# Patient Record
Sex: Female | Born: 1985 | Race: Black or African American | Hispanic: No | Marital: Married | Smoking: Never smoker
Health system: Southern US, Community
[De-identification: ages and names within clinical notes are randomized; demographics above are authoritative.]

## PROBLEM LIST (undated history)

## (undated) ENCOUNTER — Inpatient Hospital Stay (HOSPITAL_COMMUNITY): Payer: Self-pay

## (undated) DIAGNOSIS — Z8739 Personal history of other diseases of the musculoskeletal system and connective tissue: Secondary | ICD-10-CM

## (undated) DIAGNOSIS — F419 Anxiety disorder, unspecified: Secondary | ICD-10-CM

## (undated) DIAGNOSIS — B9689 Other specified bacterial agents as the cause of diseases classified elsewhere: Secondary | ICD-10-CM

## (undated) DIAGNOSIS — L409 Psoriasis, unspecified: Secondary | ICD-10-CM

## (undated) DIAGNOSIS — N760 Acute vaginitis: Secondary | ICD-10-CM

## (undated) DIAGNOSIS — O24419 Gestational diabetes mellitus in pregnancy, unspecified control: Secondary | ICD-10-CM

## (undated) DIAGNOSIS — B999 Unspecified infectious disease: Secondary | ICD-10-CM

## (undated) HISTORY — DX: Other specified bacterial agents as the cause of diseases classified elsewhere: B96.89

## (undated) HISTORY — DX: Personal history of other diseases of the musculoskeletal system and connective tissue: Z87.39

## (undated) HISTORY — DX: Acute vaginitis: N76.0

## (undated) HISTORY — PX: DILATION AND CURETTAGE OF UTERUS: SHX78

## (undated) HISTORY — DX: Gestational diabetes mellitus in pregnancy, unspecified control: O24.419

---

## 2012-04-03 ENCOUNTER — Ambulatory Visit (INDEPENDENT_AMBULATORY_CARE_PROVIDER_SITE_OTHER): Payer: PRIVATE HEALTH INSURANCE | Admitting: Obstetrics and Gynecology

## 2012-04-03 ENCOUNTER — Encounter: Payer: Self-pay | Admitting: Obstetrics and Gynecology

## 2012-04-03 VITALS — BP 112/68 | Ht 62.0 in | Wt 131.0 lb

## 2012-04-03 DIAGNOSIS — Z01419 Encounter for gynecological examination (general) (routine) without abnormal findings: Secondary | ICD-10-CM

## 2012-04-03 DIAGNOSIS — N760 Acute vaginitis: Secondary | ICD-10-CM

## 2012-04-03 DIAGNOSIS — B9689 Other specified bacterial agents as the cause of diseases classified elsewhere: Secondary | ICD-10-CM

## 2012-04-03 DIAGNOSIS — N898 Other specified noninflammatory disorders of vagina: Secondary | ICD-10-CM

## 2012-04-03 DIAGNOSIS — A499 Bacterial infection, unspecified: Secondary | ICD-10-CM

## 2012-04-03 MED ORDER — TINIDAZOLE 500 MG PO TABS: ORAL_TABLET | ORAL | Status: DC

## 2012-04-03 MED ORDER — LEVONORGESTREL-ETHINYL ESTRAD 0.1-20 MG-MCG PO TABS: 1.0000 | ORAL_TABLET | Freq: Every day | ORAL | Status: DC

## 2012-04-03 NOTE — Progress Notes (Signed)
The patient reports:normal menses, no abnormal bleeding, pelvic pain or discharge  Contraception:oral contraceptives (estrogen/progesterone)lutera  Last mammogram: patient has never had a mammogram  Last pap: was normal 2011  GC/Chlamydia cultures offered: declined HIV/RPR/HbsAg offered:  declined HSV 1 and 2 glycoprotein offered: declined  Menstrual cycle regular and monthly: Yes Menstrual flow normal: Yes  Urinary symptoms: none Normal bowel movements: Yes Reports abuse at home: No:  Pt stated bv ?  Subjective:    Carrie Coffey is a 26 y.o. female, G1P0010, who presents for an annual exam.     History   Social History  . Marital Status: Married    Spouse Name: N/A    Number of Children: N/A  . Years of Education: N/A   Social History Main Topics  . Smoking status: Never Smoker   . Smokeless tobacco: Never Used  . Alcohol Use: 0.0 oz/week    4-5 Glasses of wine per week  . Drug Use: No  . Sexually Active: Yes    Birth Control/ Protection: Pill     lutera   Other Topics Concern  . None   Social History Narrative  . None    Menstrual cycle:   LMP: Patient's last menstrual period was 03/28/2012.           Cycle: normal with BCP  The following portions of the patient's history were reviewed and updated as appropriate: allergies, current medications, past family history, past medical history, past social history, past surgical history and problem list.  Review of Systems Pertinent items are noted in HPI. Breast:Negative for breast lump,nipple discharge or nipple retraction Gastrointestinal: Negative for abdominal pain, change in bowel habits or rectal bleeding Urinary:negative   Objective:    BP 112/68  Ht 5\' 2"  (1.575 m)  Wt 131 lb (59.421 kg)  BMI 23.96 kg/m2  LMP 03/28/2012    Weight:  Wt Readings from Last 1 Encounters:  04/03/12 131 lb (59.421 kg)          BMI: Body mass index is 23.96 kg/(m^2).  General Appearance: Alert, appropriate appearance  for age. No acute distress HEENT: Grossly normal Neck / Thyroid: Supple, no masses, nodes or enlargement Lungs: clear to auscultation bilaterally Back: No CVA tenderness Breast Exam: No masses or nodes.No dimpling, nipple retraction or discharge. Right nipple piercing Cardiovascular: Regular rate and rhythm. S1, S2, no murmur Gastrointestinal: Soft, non-tender, no masses or organomegaly Pelvic Exam: Vulva and vagina appear normal. Bimanual exam reveals normal uterus and adnexa. Rectovaginal: not indicated Lymphatic Exam: Non-palpable nodes in neck, clavicular, axillary, or inguinal regions Skin: no rash or abnormalities Neurologic: Normal gait and speech, no tremor  Psychiatric: Alert and oriented, appropriate affect.   OSOM BV: positive   Assessment:    Normal gyn exam BV    Plan:    pap smear return annually or prn Tindamax prescribed STD screening: declined Contraception:oral contraceptives (estrogen/progesterone) Laymond Purser JMD

## 2012-04-04 LAB — PAP IG W/ RFLX HPV ASCU

## 2012-08-08 ENCOUNTER — Telehealth: Payer: Self-pay | Admitting: Obstetrics and Gynecology

## 2012-08-08 NOTE — Telephone Encounter (Signed)
Tc to pt regarding msg.  Pt is currently on Lutera and has been since 01/2012.  Since starting Aldean Ast has been having upper thigh pain, pt says she then stopped the birth control for a while and the pain stopped, she restarted the pills again and the pain in her thigh returned and has gotten worse.  Pt scheduled for an appt w/ SR on Thursday 08/14/12 @ 1350 and advised the pt to stop the pills until she comes in for a different option and to use condoms, pt voices agreement.

## 2012-08-14 ENCOUNTER — Ambulatory Visit: Payer: BC Managed Care – PPO | Admitting: Obstetrics and Gynecology

## 2012-08-14 ENCOUNTER — Encounter: Payer: Self-pay | Admitting: Obstetrics and Gynecology

## 2012-08-14 VITALS — BP 92/50 | Ht 61.0 in | Wt 137.0 lb

## 2012-08-14 DIAGNOSIS — Z309 Encounter for contraceptive management, unspecified: Secondary | ICD-10-CM

## 2012-08-14 MED ORDER — DROSPIRENONE-ETHINYL ESTRADIOL 3-0.02 MG PO TABS: 1.0000 | ORAL_TABLET | Freq: Every day | ORAL | Status: DC

## 2012-08-14 NOTE — Progress Notes (Signed)
Subjective:    Carrie Coffey is a 27 y.o. female, G1P0010, who presents for discuss BCP options due to right upper thigh pain x 2 months. Pt states it  was a very dull radiating pain. Pt stopped Lutera 1 week ago but now is spotting. Pt has a remote history of rheumatology work-up with "a slight increased risk of blood clot but OK to take BCP"  The following portions of the patient's history were reviewed and updated as appropriate: allergies, current medications, past family history.  Review of Systems Pertinent items are noted in HPI.     Objective:    There were no vitals taken for this visit.    Weight:  Wt Readings from Last 1 Encounters:  04/03/12 131 lb (59.421 kg)          BMI: There is no height or weight on file to calculate BMI.  General Appearance: Alert, appropriate appearance for age. No acute distress Eetrermities:no evidence of DVT. Homan - no edema   Assessment:    Contraceptive management  No evidence of DVT   Plan:    Change to Yaz with BUM x 1 month Pt to obtain records from [facility] to review prior to pregnancy return annually or prn  Silverio Lay MD

## 2013-07-29 ENCOUNTER — Inpatient Hospital Stay (HOSPITAL_COMMUNITY)
Admission: AD | Admit: 2013-07-29 | Discharge: 2013-07-29 | Disposition: A | Discharge status: Home / Self Care | Payer: BC Managed Care – PPO | Source: Ambulatory Visit | Attending: Obstetrics and Gynecology | Admitting: Obstetrics and Gynecology

## 2013-07-29 ENCOUNTER — Encounter (HOSPITAL_COMMUNITY): Payer: Self-pay | Admitting: *Deleted

## 2013-07-29 DIAGNOSIS — O00109 Unspecified tubal pregnancy without intrauterine pregnancy: Secondary | ICD-10-CM | POA: Insufficient documentation

## 2013-07-29 LAB — BUN: BUN: 6 mg/dL (ref 6–23)

## 2013-07-29 LAB — CBC WITH DIFFERENTIAL/PLATELET
BASOS PCT: 0 % (ref 0–1)
Basophils Absolute: 0 10*3/uL (ref 0.0–0.1)
EOS ABS: 0 10*3/uL (ref 0.0–0.7)
Eosinophils Relative: 1 % (ref 0–5)
HEMATOCRIT: 36.5 % (ref 36.0–46.0)
Hemoglobin: 12.5 g/dL (ref 12.0–15.0)
Lymphocytes Relative: 34 % (ref 12–46)
Lymphs Abs: 2.3 10*3/uL (ref 0.7–4.0)
MCH: 32.9 pg (ref 26.0–34.0)
MCHC: 34.2 g/dL (ref 30.0–36.0)
MCV: 96.1 fL (ref 78.0–100.0)
MONO ABS: 0.5 10*3/uL (ref 0.1–1.0)
Monocytes Relative: 8 % (ref 3–12)
Neutro Abs: 3.8 10*3/uL (ref 1.7–7.7)
Neutrophils Relative %: 57 % (ref 43–77)
Platelets: 264 10*3/uL (ref 150–400)
RBC: 3.8 MIL/uL — ABNORMAL LOW (ref 3.87–5.11)
RDW: 12.7 % (ref 11.5–15.5)
WBC: 6.6 10*3/uL (ref 4.0–10.5)

## 2013-07-29 LAB — HCG, QUANTITATIVE, PREGNANCY: hCG, Beta Chain, Quant, S: 3283 m[IU]/mL — ABNORMAL HIGH (ref <5)

## 2013-07-29 LAB — CREATININE, SERUM: CREATININE: 0.63 mg/dL (ref 0.50–1.10)

## 2013-07-29 LAB — AST: AST: 16 U/L (ref 0–37)

## 2013-07-29 MED ORDER — ONDANSETRON 4 MG PO TBDP: 4.0000 mg | ORAL_TABLET | Freq: Three times a day (TID) | ORAL | Status: DC | PRN

## 2013-07-29 MED ORDER — METHOTREXATE INJECTION FOR WOMEN'S HOSPITAL
50.0000 mg/m2 | Freq: Once | INTRAMUSCULAR | Status: AC
  Administered 2013-07-29: 85 mg via INTRAMUSCULAR
  Filled 2013-07-29: qty 1.7

## 2013-07-29 NOTE — Discharge Instructions (Signed)
Ectopic Pregnancy An ectopic pregnancy is when the fertilized egg attaches (implants) outside the uterus. Most ectopic pregnancies occur in the fallopian tube. Rarely do ectopic pregnancies occur on the ovary, intestine, pelvis, or cervix. In an ectopic pregnancy, the fertilized egg does not have the ability to develop into a normal, healthy baby.  A ruptured ectopic pregnancy is one in which the fallopian tube gets torn or bursts and results in internal bleeding. Often there is intense abdominal pain, and sometimes, vaginal bleeding. Having an ectopic pregnancy can be life threatening. If left untreated, this dangerous condition can lead to a blood transfusion, abdominal surgery, or even death. CAUSES  Damage to the fallopian tubes is the suspected cause in most ectopic pregnancies.  RISK FACTORS Depending on your circumstances, the risk of having an ectopic pregnancy will vary. The level of risk can be divided into three categories. High Risk  You have gone through infertility treatment.  You have had a previous ectopic pregnancy.  You have had previous tubal surgery.  You have had previous surgery to have the fallopian tubes tied (tubal ligation).  You have tubal problems or diseases.  You have been exposed to DES. DES is a medicine that was used until 1971 and had effects on babies whose mothers took the medicine.  You become pregnant while using an intrauterine device (IUD) for birth control. Moderate Risk  You have a history of infertility.  You have a history of a sexually transmitted infection (STI).  You have a history of pelvic inflammatory disease (PID).  You have scarring from endometriosis.  You have multiple sexual partners.  You smoke. Low Risk  You have had previous pelvic surgery.  You use vaginal douching.  You became sexually active before 28 years of age. SIGNS AND SYMPTOMS  An ectopic pregnancy should be suspected in anyone who has missed a period and  has abdominal pain or bleeding.  You may experience normal pregnancy symptoms, such as:  Nausea.  Tiredness.  Breast tenderness.  Other symptoms may include:  Pain with intercourse.  Irregular vaginal bleeding or spotting.  Cramping or pain on one side or in the lower abdomen.  Fast heartbeat.  Passing out while having a bowel movement.  Symptoms of a ruptured ectopic pregnancy and internal bleeding may include:  Sudden, severe pain in the abdomen and pelvis.  Dizziness or fainting.  Pain in the shoulder area. DIAGNOSIS  Tests that may be performed include:  A pregnancy test.  An ultrasound test.  Testing the specific level of pregnancy hormone in the bloodstream.  Taking a sample of uterus tissue (dilation and curettage, D&C).  Surgery to perform a visual exam of the inside of the abdomen using a thin, lighted tube with a tiny camera on the end (laparoscope). TREATMENT  An injection of a medicine called methotrexate may be given. This medicine causes the pregnancy tissue to be absorbed. It is given if:  The diagnosis is made early.  The fallopian tube has not ruptured.  You are considered to be a good candidate for the medicine. Usually, pregnancy hormone blood levels are checked after methotrexate treatment. This is to be sure the medicine is effective. It may take 4 6 weeks for the pregnancy to be absorbed (though most pregnancies will be absorbed by 3 weeks). Surgical treatment may be needed. A laparoscope may be used to remove the pregnancy tissue. If severe internal bleeding occurs, a cut (incision) may be made in the lower abdomen (laparotomy), and the  ectopic pregnancy is removed. This stops the bleeding. Part of the fallopian tube, or the whole tube, may be removed as well (salpingectomy). After surgery, pregnancy hormone tests may be done to be sure there is no pregnancy tissue left. You may receive an Rho(D) immune globulin shot if you are Rh negative and  the father is Rh positive, or if you do not know the Rh type of the father. This is to prevent problems with any future pregnancy. SEEK IMMEDIATE MEDICAL CARE IF:  You have any symptoms of an ectopic pregnancy. This is a medical emergency. Document Released: 08/02/2004 Document Revised: 04/15/2013 Document Reviewed: 01/22/2013 [facility] Patient Information 2014 [place], [place].   Methotrexate Treatment for an Ectopic Pregnancy An ectopic pregnancy is when the fertilized egg attaches (implants) outside the uterus. Most ectopic pregnancies occur in the fallopian tube. Rarely do ectopic pregnancies occur on the ovary, intestine, pelvis, or cervix. An ectopic pregnancy does not have the ability to develop into a normal, healthy baby. Having an ectopic pregnancy can be a life-threatening experience. However, if the ectopic pregnancy is found early enough, it can be treated with a medicine. This medicine is called methotrexate. Methotrexate works by stopping the pregnancy from growing. It helps the body absorb the pregnancy tissue over a 2 to 6 week period (though most pregnancies will be absorbed by 3 weeks).  If methotrexate is successful, there is a good chance that the fallopian tube may be saved. Regardless of whether the fallopian tube is saved, a mother who has had an ectopic pregnancy is at a much higher risk of having another ectopic occur in future pregnancies. One serious concern is the potential for the fallopian tube to tear (rupture). If it does, emergency surgery is needed to remove the pregnancy, and methotrexate cannot be used. The ideal patient for methotrexate is a person who is:   Not bleeding internally.  Has no severe or persistent abdominal pain.  Is committed to following through with lab tests and appointments until the ectopic has absorbed.  Is healthy and has normal liver and kidney functions on evaluation. Methotrexate should not be given to women who:  Are  breastfeeding.  Have a normal pregnancy (intrauterine pregnancy).  Have liver, lung, or kidney disease.  Have blood problems.  Are allergic to methotrexate.  Have peptic ulcers.  Have an ectopic pregnancy larger than 1 inches (3.5 cm) or one that has fetal heartbeats. This is a rule that is followed most of the time (relative contraindication). BEFORE THE TREATMENT Before giving the medicine:  Liver tests, kidney tests, and a complete blood test are performed.  Blood tests are performed to measure the pregnancy hormone levels and to determine the mother's blood type.  If the woman is Rh negative, and the father is Rh positive or his Rh type is not known, a RhoGAM shot is given. TREATMENT  There are 2 methods that your caregiver may use to prescribe methotrexate. One method involves a single dose or injection of the medicine. Another method involves a series of doses. This method involves several injections.  AFTER THE TREATMENT Blood tests will be taken for several weeks to check the pregnancy hormone levels. The blood tests are performed until there is no more pregnancy hormone detected in the blood. There is still a risk of the ectopic pregnancy rupturing while using the methotrexate. There are also side effects of methotrexate, which include:   Nausea and vomiting.  Mouth sores.  Diarrhea.  Rash.  Dizziness.  abdominal pain. °· Increased vaginal bleeding or spotting. °· Pneumonia. °· Failed treatment. °· Hair loss. This is rare and reversible. °On very rare occasions, the medicine may affect your blood counts, liver, kidney, bone marrow, or hormone levels. If this happens, your caregiver will want to perform further evaluations. °Document Released: 06/19/2001 Document Revised: 09/17/2011 Document Reviewed: 03/01/2011 °ExitCare® Patient Information ©2014 [employer]. ° °

## 2013-07-29 NOTE — MAU Provider Note (Signed)
History   28yo, G2P0010 at [redacted]w[redacted]d was sent over from the office after u/s showed no IUP and a mass superior medial to the left ovary.  Peripheral color flow seen.  Mass is 2.5 cm x 1.06 cm x 1.2 cm.  Denies pain or bleeding today.  Denies recent fever, resp or GI c/o's, UTI s/s.   1/14 Quant = 502 1/19 Quant = 2567.2 Blood type = O pos  Chief Complaint  Patient presents with  . Abdominal Pain   HPI  OB History   Grav Para Term Preterm Abortions TAB SAB Ect Mult Living   2    1  1          Past Medical History  Diagnosis Date  . BV (bacterial vaginosis)   . H/O joint problems     History reviewed. No pertinent past surgical history.  History reviewed. No pertinent family history.  History  Substance Use Topics  . Smoking status: Never Smoker   . Smokeless tobacco: Never Used  . Alcohol Use: 0.0 oz/week    4-5 Glasses of wine per week    Allergies:  Allergies  Allergen Reactions  . Allegra Allergy [Fexofenadine Hcl] Hives  . Fruit & Vegetable Daily [Nutritional Supplements]     Apples   . Loratadine-Pseudoephedrine Er Hives    Prescriptions prior to admission  Medication Sig Dispense Refill  . clonazePAM (KLONOPIN) 0.5 MG tablet Take 0.5 mg by mouth 2 (two) times daily as needed for anxiety.      . Prenatal Vit-Fe Fumarate-FA (PRENATAL MULTIVITAMIN) TABS tablet Take 1 tablet by mouth daily at 12 noon.        ROS ROS: see HPI above, all other systems are negative  Physical Exam   Blood pressure 132/73, pulse 91, temperature 98.7 F (37.1 C), temperature source Oral, resp. rate 16, height 5\' 2"  (1.575 m), weight 138 lb (62.596 kg), last menstrual period 07/18/2012, SpO2 100.00%.  Physical Exam Chest: Clear Heart: RRR Abdomen: gravid, NT Extremities: WNL  Results for orders placed during the hospital encounter of 07/29/13 (from the past 24 hour(s))  TYPE AND SCREEN     Status: None   Collection Time    07/29/13  9:30 PM      Result Value Range   ABO/RH(D) O POS     Antibody Screen PENDING     Sample Expiration 08/01/2013    CREATININE, SERUM     Status: None   Collection Time    07/29/13  9:30 PM      Result Value Range   Creatinine, Ser 0.63  0.50 - 1.10 mg/dL   GFR calc non Af Amer >90  >90 mL/min   GFR calc Af Amer >90  >90 mL/min  BUN     Status: None   Collection Time    07/29/13  9:30 PM      Result Value Range   BUN 6  6 - 23 mg/dL  AST     Status: None   Collection Time    07/29/13  9:30 PM      Result Value Range   AST 16  0 - 37 U/L  CBC WITH DIFFERENTIAL     Status: Abnormal   Collection Time    07/29/13  9:30 PM      Result Value Range   WBC 6.6  4.0 - 10.5 K/uL   RBC 3.80 (*) 3.87 - 5.11 MIL/uL   Hemoglobin 12.5  12.0 - 15.0 g/dL   HCT  36.5  36.0 - 46.0 %   MCV 96.1  78.0 - 100.0 fL   MCH 32.9  26.0 - 34.0 pg   MCHC 34.2  30.0 - 36.0 g/dL   RDW 12.7  11.5 - 15.5 %   Platelets 264  150 - 400 K/uL   Neutrophils Relative % 57  43 - 77 %   Neutro Abs 3.8  1.7 - 7.7 K/uL   Lymphocytes Relative 34  12 - 46 %   Lymphs Abs 2.3  0.7 - 4.0 K/uL   Monocytes Relative 8  3 - 12 %   Monocytes Absolute 0.5  0.1 - 1.0 K/uL   Eosinophils Relative 1  0 - 5 %   Eosinophils Absolute 0.0  0.0 - 0.7 K/uL   Basophils Relative 0  0 - 1 %   Basophils Absolute 0.0  0.0 - 0.1 K/uL  HCG, QUANTITATIVE, PREGNANCY     Status: Abnormal   Collection Time    07/29/13  9:30 PM      Result Value Range   hCG, Beta Chain, Quant, S 3283 (*) <5 mIU/mL    ED Course  Ectopic pregnancy at [redacted]w[redacted]d by LMP  R&B of both Methotrexate and surgery were reviewed with the patient -- patient verbalizes understanding of these risks and wishes to proceed with Methotrexate therapy.  Labs ordered - WNL CBC AST BUN Creatinine Quant Methotrexate given  Precautions and SE discussed  Zofran 4mg  ODT rx'd To f/u on Saturday (day 4) for repeat quant in MAU     Linda Hedges CNM, MSN 07/29/2013 8:23 PM

## 2013-07-29 NOTE — MAU Note (Signed)
Pt states she was seen in the office at [facility] pregnancy test. Pt states  Her LMP June 11, 2013 pt had hormone levels which were doubling but nothing noted in the uterus in the office.Pt sent her for further evaluation.

## 2013-07-29 NOTE — MAU Note (Signed)
Pt reports she is 6 weeks preg and has had some spotting during pregnancy. They have been following BHCG's and they have been doubling but had more spotting today and had an ultrasound at the office and they called her this pm and said they did not see anything in the uterus and wanted her to come in to rule out ectopic.

## 2013-07-30 ENCOUNTER — Other Ambulatory Visit: Payer: Self-pay | Admitting: Obstetrics and Gynecology

## 2013-07-30 DIAGNOSIS — O3680X0 Pregnancy with inconclusive fetal viability, not applicable or unspecified: Secondary | ICD-10-CM

## 2013-07-31 LAB — TYPE AND SCREEN
ABO/RH(D): O POS
Antibody Screen: POSITIVE
DAT, IGG: NEGATIVE
UNIT DIVISION: 0
Unit division: 0

## 2013-08-01 ENCOUNTER — Inpatient Hospital Stay (HOSPITAL_COMMUNITY)
Admission: AD | Admit: 2013-08-01 | Discharge: 2013-08-01 | Disposition: A | Discharge status: Home / Self Care | Payer: BC Managed Care – PPO | Source: Ambulatory Visit | Attending: Obstetrics and Gynecology | Admitting: Obstetrics and Gynecology

## 2013-08-01 ENCOUNTER — Encounter (HOSPITAL_COMMUNITY): Payer: Self-pay | Admitting: *Deleted

## 2013-08-01 ENCOUNTER — Other Ambulatory Visit (HOSPITAL_COMMUNITY): Payer: Self-pay | Admitting: Obstetrics and Gynecology

## 2013-08-01 DIAGNOSIS — M542 Cervicalgia: Secondary | ICD-10-CM | POA: Insufficient documentation

## 2013-08-01 DIAGNOSIS — O00109 Unspecified tubal pregnancy without intrauterine pregnancy: Secondary | ICD-10-CM | POA: Insufficient documentation

## 2013-08-01 DIAGNOSIS — K6289 Other specified diseases of anus and rectum: Secondary | ICD-10-CM | POA: Insufficient documentation

## 2013-08-01 DIAGNOSIS — O009 Unspecified ectopic pregnancy without intrauterine pregnancy: Secondary | ICD-10-CM | POA: Diagnosis present

## 2013-08-01 DIAGNOSIS — R109 Unspecified abdominal pain: Secondary | ICD-10-CM | POA: Insufficient documentation

## 2013-08-01 HISTORY — DX: Psoriasis, unspecified: L40.9

## 2013-08-01 LAB — CBC
HCT: 37.5 % (ref 36.0–46.0)
Hemoglobin: 12.9 g/dL (ref 12.0–15.0)
MCH: 32.7 pg (ref 26.0–34.0)
MCHC: 34.4 g/dL (ref 30.0–36.0)
MCV: 95.2 fL (ref 78.0–100.0)
Platelets: 255 10*3/uL (ref 150–400)
RBC: 3.94 MIL/uL (ref 3.87–5.11)
RDW: 12.6 % (ref 11.5–15.5)
WBC: 5.1 10*3/uL (ref 4.0–10.5)

## 2013-08-01 LAB — HCG, QUANTITATIVE, PREGNANCY: hCG, Beta Chain, Quant, S: 4811 m[IU]/mL — ABNORMAL HIGH (ref <5)

## 2013-08-01 NOTE — MAU Note (Signed)
Pt. Here for follow up labs from January 21. Pt. Complaining of neck pain and rectal pain. Also, having left sided abdominal pain that patient rates 3/10. Denies any bleeding. Pt. States she is nauseated. Denies vomiting. Denies diarrhea.

## 2013-08-01 NOTE — Discharge Instructions (Signed)
Ectopic Pregnancy An ectopic pregnancy is when the fertilized egg attaches (implants) outside the uterus. Most ectopic pregnancies occur in the fallopian tube. Rarely do ectopic pregnancies occur on the ovary, intestine, pelvis, or cervix. In an ectopic pregnancy, the fertilized egg does not have the ability to develop into a normal, healthy baby.  A ruptured ectopic pregnancy is one in which the fallopian tube gets torn or bursts and results in internal bleeding. Often there is intense abdominal pain, and sometimes, vaginal bleeding. Having an ectopic pregnancy can be life threatening. If left untreated, this dangerous condition can lead to a blood transfusion, abdominal surgery, or even death. CAUSES  Damage to the fallopian tubes is the suspected cause in most ectopic pregnancies.  RISK FACTORS Depending on your circumstances, the risk of having an ectopic pregnancy will vary. The level of risk can be divided into three categories. High Risk  You have gone through infertility treatment.  You have had a previous ectopic pregnancy.  You have had previous tubal surgery.  You have had previous surgery to have the fallopian tubes tied (tubal ligation).  You have tubal problems or diseases.  You have been exposed to DES. DES is a medicine that was used until 1971 and had effects on babies whose mothers took the medicine.  You become pregnant while using an intrauterine device (IUD) for birth control. Moderate Risk  You have a history of infertility.  You have a history of a sexually transmitted infection (STI).  You have a history of pelvic inflammatory disease (PID).  You have scarring from endometriosis.  You have multiple sexual partners.  You smoke. Low Risk  You have had previous pelvic surgery.  You use vaginal douching.  You became sexually active before 28 years of age. SIGNS AND SYMPTOMS  An ectopic pregnancy should be suspected in anyone who has missed a period and  has abdominal pain or bleeding.  You may experience normal pregnancy symptoms, such as:  Nausea.  Tiredness.  Breast tenderness.  Other symptoms may include:  Pain with intercourse.  Irregular vaginal bleeding or spotting.  Cramping or pain on one side or in the lower abdomen.  Fast heartbeat.  Passing out while having a bowel movement.  Symptoms of a ruptured ectopic pregnancy and internal bleeding may include:  Sudden, severe pain in the abdomen and pelvis.  Dizziness or fainting.  Pain in the shoulder area. DIAGNOSIS  Tests that may be performed include:  A pregnancy test.  An ultrasound test.  Testing the specific level of pregnancy hormone in the bloodstream.  Taking a sample of uterus tissue (dilation and curettage, D&C).  Surgery to perform a visual exam of the inside of the abdomen using a thin, lighted tube with a tiny camera on the end (laparoscope). TREATMENT  An injection of a medicine called methotrexate may be given. This medicine causes the pregnancy tissue to be absorbed. It is given if:  The diagnosis is made early.  The fallopian tube has not ruptured.  You are considered to be a good candidate for the medicine. Usually, pregnancy hormone blood levels are checked after methotrexate treatment. This is to be sure the medicine is effective. It may take 4 6 weeks for the pregnancy to be absorbed (though most pregnancies will be absorbed by 3 weeks). Surgical treatment may be needed. A laparoscope may be used to remove the pregnancy tissue. If severe internal bleeding occurs, a cut (incision) may be made in the lower abdomen (laparotomy), and the  ectopic pregnancy is removed. This stops the bleeding. Part of the fallopian tube, or the whole tube, may be removed as well (salpingectomy). After surgery, pregnancy hormone tests may be done to be sure there is no pregnancy tissue left. You may receive an Rho(D) immune globulin shot if you are Rh negative and  the father is Rh positive, or if you do not know the Rh type of the father. This is to prevent problems with any future pregnancy. °SEEK IMMEDIATE MEDICAL CARE IF:  °You have any symptoms of an ectopic pregnancy. This is a medical emergency. °Document Released: 08/02/2004 Document Revised: 04/15/2013 Document Reviewed: 01/22/2013 °ExitCare® Patient Information ©2014 [employer]. ° °

## 2013-08-05 ENCOUNTER — Inpatient Hospital Stay (HOSPITAL_COMMUNITY)
Admission: AD | Admit: 2013-08-05 | Discharge: 2013-08-05 | Disposition: A | Discharge status: Home / Self Care | Payer: BC Managed Care – PPO | Source: Ambulatory Visit | Attending: Obstetrics and Gynecology | Admitting: Obstetrics and Gynecology

## 2013-08-05 DIAGNOSIS — O00109 Unspecified tubal pregnancy without intrauterine pregnancy: Secondary | ICD-10-CM | POA: Insufficient documentation

## 2013-08-05 LAB — COMPREHENSIVE METABOLIC PANEL
ALBUMIN: 3.5 g/dL (ref 3.5–5.2)
ALT: 20 U/L (ref 0–35)
AST: 25 U/L (ref 0–37)
Alkaline Phosphatase: 45 U/L (ref 39–117)
BILIRUBIN TOTAL: 0.5 mg/dL (ref 0.3–1.2)
BUN: 8 mg/dL (ref 6–23)
CHLORIDE: 103 meq/L (ref 96–112)
CO2: 20 mEq/L (ref 19–32)
Calcium: 9.1 mg/dL (ref 8.4–10.5)
Creatinine, Ser: 0.73 mg/dL (ref 0.50–1.10)
GFR calc Af Amer: >90 mL/min (ref >90)
Glucose, Bld: 100 mg/dL — ABNORMAL HIGH (ref 70–99)
Potassium: 3.8 mEq/L (ref 3.7–5.3)
Sodium: 137 mEq/L (ref 137–147)
Total Protein: 7.7 g/dL (ref 6.0–8.3)

## 2013-08-05 LAB — CBC WITH DIFFERENTIAL/PLATELET
Basophils Absolute: 0 10*3/uL (ref 0.0–0.1)
Basophils Relative: 1 % (ref 0–1)
Eosinophils Absolute: 0 10*3/uL (ref 0.0–0.7)
Eosinophils Relative: 1 % (ref 0–5)
HCT: 36.2 % (ref 36.0–46.0)
Hemoglobin: 12.2 g/dL (ref 12.0–15.0)
LYMPHS ABS: 2.1 10*3/uL (ref 0.7–4.0)
LYMPHS PCT: 47 % — AB (ref 12–46)
MCH: 32.3 pg (ref 26.0–34.0)
MCHC: 33.7 g/dL (ref 30.0–36.0)
MCV: 95.8 fL (ref 78.0–100.0)
Monocytes Absolute: 0.4 10*3/uL (ref 0.1–1.0)
Monocytes Relative: 10 % (ref 3–12)
NEUTROS PCT: 42 % — AB (ref 43–77)
Neutro Abs: 1.8 10*3/uL (ref 1.7–7.7)
Platelets: 282 10*3/uL (ref 150–400)
RBC: 3.78 MIL/uL — ABNORMAL LOW (ref 3.87–5.11)
RDW: 13.1 % (ref 11.5–15.5)
WBC: 4.4 10*3/uL (ref 4.0–10.5)

## 2013-08-05 LAB — HCG, QUANTITATIVE, PREGNANCY: hCG, Beta Chain, Quant, S: 4679 m[IU]/mL — ABNORMAL HIGH (ref <5)

## 2013-08-05 MED ORDER — METHOTREXATE INJECTION FOR WOMEN'S HOSPITAL
50.0000 mg/m2 | Freq: Once | INTRAMUSCULAR | Status: AC
  Administered 2013-08-05: 85 mg via INTRAMUSCULAR
  Filled 2013-08-05: qty 1.7

## 2013-08-05 MED ORDER — HYDROCODONE-ACETAMINOPHEN 5-325 MG PO TABS: 1.0000 | ORAL_TABLET | Freq: Four times a day (QID) | ORAL | Status: DC | PRN

## 2013-08-05 NOTE — Progress Notes (Signed)
Called about pt who presented for quant day 8 of 1st dose methotrexate dose.  There was not an appropriate drop (even though it was collected a day late) and I have ordered a 2nd dose.  The patient had left MAU but Butch Penny will call pt to return for labs and 2nd dose of MTX.

## 2013-08-05 NOTE — MAU Note (Signed)
Phoned patient to remind her to return for MTX injection. Message left.

## 2013-08-05 NOTE — MAU Note (Signed)
Patient had to go to work pending the lab results. Patient was phoned with the result and informed that Dr. Mancel Bale had reviewed the results and wanted her to have a second dose of MTX. Patient stated that she was unable to come back until around 1500. MTX returned to the pharmacy until patient returns.

## 2013-08-05 NOTE — MAU Note (Signed)
Patient to MAU for day 7 BHCG s/p MTX. Patient denies bleeding but states she has random mild pain.

## 2013-08-08 ENCOUNTER — Encounter (HOSPITAL_COMMUNITY): Payer: Self-pay | Admitting: Obstetrics and Gynecology

## 2013-08-08 ENCOUNTER — Inpatient Hospital Stay (HOSPITAL_COMMUNITY)
Admission: AD | Admit: 2013-08-08 | Discharge: 2013-08-08 | Disposition: A | Discharge status: Home / Self Care | Payer: BC Managed Care – PPO | Source: Ambulatory Visit | Attending: Obstetrics and Gynecology | Admitting: Obstetrics and Gynecology

## 2013-08-08 DIAGNOSIS — R109 Unspecified abdominal pain: Secondary | ICD-10-CM | POA: Insufficient documentation

## 2013-08-08 DIAGNOSIS — O00109 Unspecified tubal pregnancy without intrauterine pregnancy: Secondary | ICD-10-CM | POA: Insufficient documentation

## 2013-08-08 LAB — HCG, QUANTITATIVE, PREGNANCY: HCG, BETA CHAIN, QUANT, S: 3589 m[IU]/mL — AB (ref <5)

## 2013-08-08 NOTE — MAU Note (Signed)
Dione Booze CNM notified of pt's return for repeat BHCG  After 2nd dose of MTX on 1/28. CNM finishing delivery and will see pt

## 2013-08-08 NOTE — Progress Notes (Signed)
Notified of pt's return to MAU for repeat lab testing. Pt was seen three days ago and left for work. Was called and notified she needed to return for 2nd MTX injection and never returned. Pt here now. CNM will come see pt.

## 2013-08-08 NOTE — MAU Provider Note (Signed)
  History   CSN: [id number]  Arrival date and time: 08/08/13 1814   Chief Complaint  Patient presents with  . Follow-Up    HPI Pt presents to MAU for repeat quant hCg due to ectopic pregnancy.  Pt has received 2 doses of methotrexate.  Reports she began having light vaginal bleeding yesterday but denies any persistent or severe abdominal pain as well as denies weakness or dizziness. Reports very occasional sharp shooting lower abdominal pain which is brief in duration.  Denies complaints at the present time.  OB History   Grav Para Term Preterm Abortions TAB SAB Ect Mult Living   5    4 2 2    0      Past Medical History  Diagnosis Date  . BV (bacterial vaginosis)   . H/O joint problems   . Psoriasis     Past Surgical History  Procedure Laterality Date  . Dilation and curettage of uterus      History reviewed. No pertinent family history.  History  Substance Use Topics  . Smoking status: Never Smoker   . Smokeless tobacco: Never Used  . Alcohol Use: No    Allergies:  Allergies  Allergen Reactions  . Fruit & Vegetable Daily [Nutritional Supplements]     Apples   . Pseudoephedrine Hives    No prescriptions prior to admission    Review of Systems  Constitutional: Negative.   HENT: Negative.   Eyes: Negative.   Respiratory: Negative.   Cardiovascular: Negative.   Gastrointestinal: Negative.   Genitourinary: Negative.   Musculoskeletal: Negative.   Skin: Negative.   Neurological: Negative.   Endo/Heme/Allergies: Negative.   Psychiatric/Behavioral: Negative.    Physical Exam   Blood pressure 113/74, pulse 89, temperature 98.4 F (36.9 C), temperature source Oral, resp. rate 18, height 5' 0.75" (1.543 m), weight 63.277 kg (139 lb 8 oz), last menstrual period 07/18/2012.  Physical Exam  Nursing note and vitals reviewed. Constitutional: She is oriented to person, place, and time. She appears well-developed and well-nourished. No distress.  HENT:  Head:  Normocephalic.  Eyes: Conjunctivae are normal. Pupils are equal, round, and reactive to light.  Neck: Normal range of motion.  Cardiovascular: Normal rate, regular rhythm and intact distal pulses.   Respiratory: Effort normal and breath sounds normal.  GI: Soft. Bowel sounds are normal. She exhibits no distension. There is no tenderness. There is no rebound and no guarding.  Genitourinary:  Pelvic deferred  Musculoskeletal: Normal range of motion.  Neurological: She is alert and oriented to person, place, and time. She has normal reflexes.  Skin: Skin is warm and dry.  Psychiatric: She has a normal mood and affect. Her behavior is normal.    MAU Course  Procedures 08/08/13  Quant hCg 3589 08/05/13  Quant hCg 4679 and 2nd dose methotrexate given 08/01/13  Quant hCg 4811 07/29/13  Quant hCg 3282 and 1st dose methotrexate given   Assessment and Plan  Ectopic pregnancy txed with methotrexate  Consult obtained with Dr. Raphael Gibney Will now follow weekly quant hCg due to decrease of quant hCg > 15% from last methotrexate dose and last quant. Revd ectopic s/s. Future order for repeat quant hCg entered.   [place] O. 08/08/2013, 9:29 PM

## 2013-08-08 NOTE — MAU Note (Signed)
Pt states here for f/u BHCG. Is having pain and bleeding. Did receive 2nd MTX injection on 08/05/2013 at 2234. Here for BHCG only.

## 2013-08-08 NOTE — MAU Note (Signed)
Dione Booze CNM took pt to Rm #7 to discuss lab results and plan of care.

## 2013-08-08 NOTE — Progress Notes (Signed)
Message left to notify of pt in waiting room and pt waiting on return phone call.

## 2013-09-10 ENCOUNTER — Encounter (HOSPITAL_COMMUNITY): Payer: BC Managed Care – PPO | Admitting: Anesthesiology

## 2013-09-10 ENCOUNTER — Encounter (HOSPITAL_COMMUNITY): Payer: Self-pay | Admitting: *Deleted

## 2013-09-10 ENCOUNTER — Encounter (HOSPITAL_COMMUNITY)
Admission: AD | Disposition: A | Discharge status: Home / Self Care | Payer: Self-pay | Source: Ambulatory Visit | Attending: Obstetrics and Gynecology

## 2013-09-10 ENCOUNTER — Inpatient Hospital Stay (HOSPITAL_COMMUNITY): Payer: BC Managed Care – PPO | Admitting: Anesthesiology

## 2013-09-10 ENCOUNTER — Ambulatory Visit (HOSPITAL_COMMUNITY)
Admission: AD | Admit: 2013-09-10 | Discharge: 2013-09-11 | Disposition: A | Discharge status: Home / Self Care | Payer: BC Managed Care – PPO | Source: Ambulatory Visit | Attending: Obstetrics and Gynecology | Admitting: Obstetrics and Gynecology

## 2013-09-10 DIAGNOSIS — K661 Hemoperitoneum: Secondary | ICD-10-CM | POA: Insufficient documentation

## 2013-09-10 DIAGNOSIS — O00109 Unspecified tubal pregnancy without intrauterine pregnancy: Secondary | ICD-10-CM | POA: Insufficient documentation

## 2013-09-10 HISTORY — PX: UNILATERAL SALPINGECTOMY: SHX6160

## 2013-09-10 HISTORY — PX: LAPAROSCOPY: SHX197

## 2013-09-10 LAB — CBC
HCT: 34.1 % — ABNORMAL LOW (ref 36.0–46.0)
Hemoglobin: 11.4 g/dL — ABNORMAL LOW (ref 12.0–15.0)
MCH: 33.2 pg (ref 26.0–34.0)
MCHC: 33.4 g/dL (ref 30.0–36.0)
MCV: 99.4 fL (ref 78.0–100.0)
Platelets: 362 10*3/uL (ref 150–400)
RBC: 3.43 MIL/uL — ABNORMAL LOW (ref 3.87–5.11)
RDW: 14.4 % (ref 11.5–15.5)
WBC: 8.4 10*3/uL (ref 4.0–10.5)

## 2013-09-10 LAB — TYPE AND SCREEN
ABO/RH(D): O POS
ANTIBODY SCREEN: NEGATIVE

## 2013-09-10 SURGERY — LAPAROSCOPY OPERATIVE
Anesthesia: General | Site: Abdomen

## 2013-09-10 MED ORDER — NEOSTIGMINE METHYLSULFATE 1 MG/ML IJ SOLN
INTRAMUSCULAR | Status: DC | PRN
  Administered 2013-09-10: 4 mg via INTRAVENOUS

## 2013-09-10 MED ORDER — DEXAMETHASONE SODIUM PHOSPHATE 10 MG/ML IJ SOLN
INTRAMUSCULAR | Status: DC | PRN
  Administered 2013-09-10: 10 mg via INTRAVENOUS

## 2013-09-10 MED ORDER — NEOSTIGMINE METHYLSULFATE 1 MG/ML IJ SOLN
INTRAMUSCULAR | Status: AC
  Filled 2013-09-10: qty 1

## 2013-09-10 MED ORDER — FENTANYL CITRATE 0.05 MG/ML IJ SOLN
INTRAMUSCULAR | Status: AC
  Filled 2013-09-10: qty 2

## 2013-09-10 MED ORDER — ONDANSETRON HCL 4 MG/2ML IJ SOLN
INTRAMUSCULAR | Status: AC
  Filled 2013-09-10: qty 2

## 2013-09-10 MED ORDER — ROCURONIUM BROMIDE 100 MG/10ML IV SOLN
INTRAVENOUS | Status: DC | PRN
  Administered 2013-09-10: 40 mg via INTRAVENOUS

## 2013-09-10 MED ORDER — BUPIVACAINE HCL (PF) 0.25 % IJ SOLN
INTRAMUSCULAR | Status: DC | PRN
  Administered 2013-09-10: 5 mL

## 2013-09-10 MED ORDER — LACTATED RINGERS IR SOLN
Status: DC | PRN
  Administered 2013-09-10: 3000 mL

## 2013-09-10 MED ORDER — PROPOFOL 10 MG/ML IV EMUL
INTRAVENOUS | Status: AC
  Filled 2013-09-10: qty 20

## 2013-09-10 MED ORDER — OXYCODONE-ACETAMINOPHEN 5-325 MG PO TABS: 1.0000 | ORAL_TABLET | Freq: Four times a day (QID) | ORAL | Status: DC | PRN

## 2013-09-10 MED ORDER — KETOROLAC TROMETHAMINE 30 MG/ML IJ SOLN: 30.0000 mg | Freq: Once | INTRAMUSCULAR | Status: DC

## 2013-09-10 MED ORDER — MIDAZOLAM HCL 2 MG/2ML IJ SOLN
INTRAMUSCULAR | Status: AC
  Filled 2013-09-10: qty 2

## 2013-09-10 MED ORDER — LACTATED RINGERS IV SOLN
INTRAVENOUS | Status: DC
  Administered 2013-09-10 (×3): via INTRAVENOUS

## 2013-09-10 MED ORDER — LIDOCAINE HCL (CARDIAC) 20 MG/ML IV SOLN
INTRAVENOUS | Status: AC
  Filled 2013-09-10: qty 5

## 2013-09-10 MED ORDER — PROPOFOL 10 MG/ML IV BOLUS
INTRAVENOUS | Status: DC | PRN
  Administered 2013-09-10: 200 mg via INTRAVENOUS

## 2013-09-10 MED ORDER — GLYCOPYRROLATE 0.2 MG/ML IJ SOLN
INTRAMUSCULAR | Status: AC
  Filled 2013-09-10: qty 3

## 2013-09-10 MED ORDER — GLYCOPYRROLATE 0.2 MG/ML IJ SOLN
INTRAMUSCULAR | Status: DC | PRN
  Administered 2013-09-10: 0.6 mg via INTRAVENOUS

## 2013-09-10 MED ORDER — MEPERIDINE HCL 25 MG/ML IJ SOLN: 6.2500 mg | INTRAMUSCULAR | Status: DC | PRN

## 2013-09-10 MED ORDER — FAMOTIDINE IN NACL 20-0.9 MG/50ML-% IV SOLN
20.0000 mg | Freq: Once | INTRAVENOUS | Status: AC
  Administered 2013-09-10: 20 mg via INTRAVENOUS
  Filled 2013-09-10: qty 50

## 2013-09-10 MED ORDER — ONDANSETRON HCL 4 MG/2ML IJ SOLN
INTRAMUSCULAR | Status: DC | PRN
  Administered 2013-09-10: 4 mg via INTRAVENOUS

## 2013-09-10 MED ORDER — LIDOCAINE HCL (CARDIAC) 20 MG/ML IV SOLN
INTRAVENOUS | Status: DC | PRN
  Administered 2013-09-10: 100 mg via INTRAVENOUS

## 2013-09-10 MED ORDER — KETOROLAC TROMETHAMINE 30 MG/ML IJ SOLN: 15.0000 mg | Freq: Once | INTRAMUSCULAR | Status: AC | PRN

## 2013-09-10 MED ORDER — HYDROMORPHONE HCL PF 1 MG/ML IJ SOLN
0.2500 mg | INTRAMUSCULAR | Status: DC | PRN
  Administered 2013-09-11: 0.5 mg via INTRAVENOUS

## 2013-09-10 MED ORDER — HYDROMORPHONE HCL PF 1 MG/ML IJ SOLN
INTRAMUSCULAR | Status: AC
  Administered 2013-09-11: 0.5 mg via INTRAVENOUS
  Filled 2013-09-10: qty 1

## 2013-09-10 MED ORDER — FENTANYL CITRATE 0.05 MG/ML IJ SOLN
INTRAMUSCULAR | Status: DC | PRN
  Administered 2013-09-10 (×2): 50 ug via INTRAVENOUS
  Administered 2013-09-10: 100 ug via INTRAVENOUS
  Administered 2013-09-10: 200 ug via INTRAVENOUS
  Administered 2013-09-10: 50 ug via INTRAVENOUS

## 2013-09-10 MED ORDER — KETOROLAC TROMETHAMINE 30 MG/ML IJ SOLN
INTRAMUSCULAR | Status: DC | PRN
  Administered 2013-09-10: 30 mg via INTRAVENOUS

## 2013-09-10 MED ORDER — FENTANYL CITRATE 0.05 MG/ML IJ SOLN
INTRAMUSCULAR | Status: AC
  Filled 2013-09-10: qty 5

## 2013-09-10 MED ORDER — PROMETHAZINE HCL 25 MG/ML IJ SOLN: 6.2500 mg | INTRAMUSCULAR | Status: DC | PRN

## 2013-09-10 MED ORDER — MIDAZOLAM HCL 2 MG/2ML IJ SOLN
INTRAMUSCULAR | Status: DC | PRN
  Administered 2013-09-10: 2 mg via INTRAVENOUS

## 2013-09-10 SURGICAL SUPPLY — 30 items
CHLORAPREP W/TINT 26ML (MISCELLANEOUS) ×4 IMPLANT
CLOSURE WOUND 1/4X4 (GAUZE/BANDAGES/DRESSINGS)
CLOTH BEACON ORANGE TIMEOUT ST (SAFETY) ×4 IMPLANT
DERMABOND ADVANCED (GAUZE/BANDAGES/DRESSINGS) ×2
DERMABOND ADVANCED .7 DNX12 (GAUZE/BANDAGES/DRESSINGS) ×2 IMPLANT
FORCEPS CUTTING 33CM 5MM (CUTTING FORCEPS) IMPLANT
GLOVE BIO SURGEON STRL SZ 6.5 (GLOVE) ×3 IMPLANT
GLOVE BIO SURGEONS STRL SZ 6.5 (GLOVE) ×1
GLOVE BIOGEL PI IND STRL 6.5 (GLOVE) ×4 IMPLANT
GLOVE BIOGEL PI INDICATOR 6.5 (GLOVE) ×4
GOWN STRL REUS W/TWL LRG LVL3 (GOWN DISPOSABLE) ×8 IMPLANT
NEEDLE INSUFFLATION 120MM (ENDOMECHANICALS) ×4 IMPLANT
PACK LAPAROSCOPY BASIN (CUSTOM PROCEDURE TRAY) ×4 IMPLANT
PAD OB MATERNITY 4.3X12.25 (PERSONAL CARE ITEMS) ×4 IMPLANT
POUCH SPECIMEN RETRIEVAL 10MM (ENDOMECHANICALS) IMPLANT
PROTECTOR NERVE ULNAR (MISCELLANEOUS) ×8 IMPLANT
SCALPEL HARMONIC ACE (MISCELLANEOUS) ×4 IMPLANT
SET IRRIG TUBING LAPAROSCOPIC (IRRIGATION / IRRIGATOR) IMPLANT
STRIP CLOSURE SKIN 1/4X4 (GAUZE/BANDAGES/DRESSINGS) IMPLANT
SUT MON AB 4-0 PS1 27 (SUTURE) ×4 IMPLANT
SUT VICRYL 0 UR6 27IN ABS (SUTURE) ×4 IMPLANT
TOWEL OR 17X24 6PK STRL BLUE (TOWEL DISPOSABLE) ×8 IMPLANT
TRAY FOLEY CATH 14FR (SET/KITS/TRAYS/PACK) ×4 IMPLANT
TROCAR OPTI TIP 5M 100M (ENDOMECHANICALS) ×4 IMPLANT
TROCAR XCEL DIL TIP R 11M (ENDOMECHANICALS) ×4 IMPLANT
TROCAR XCEL NON-BLD 11X100MML (ENDOMECHANICALS) ×4 IMPLANT
TROCAR XCEL NON-BLD 5MMX100MML (ENDOMECHANICALS) ×4 IMPLANT
TROCAR XCEL OPT SLVE 5M 100M (ENDOMECHANICALS) IMPLANT
WARMER LAPAROSCOPE (MISCELLANEOUS) ×4 IMPLANT
WATER STERILE IRR 1000ML POUR (IV SOLUTION) IMPLANT

## 2013-09-10 NOTE — Anesthesia Postprocedure Evaluation (Signed)
Anesthesia Post Note  Patient: Carrie Coffey  Procedure(s) Performed: Procedure(s) (LRB): LAPAROSCOPY OPERATIVE (N/A) UNILATERAL SALPINGECTOMY (Left)  Anesthesia type: General  Patient location: PACU  Post pain: Pain level controlled  Post assessment: Post-op Vital signs reviewed  Last Vitals:  Filed Vitals:   09/10/13 2318  BP: 127/69  Pulse: 99  Temp: 37.1 C  Resp: 63    Post vital signs: Reviewed  Level of consciousness: sedated  Complications: No apparent anesthesia complications

## 2013-09-10 NOTE — MAU Note (Signed)
Pt. Had an ectopic in January and had methotrexate x2 at that time. Went to her doctor today for a follow up. Pt. Has been having weekly blood work done since January and had this repeated Monday in which her levels have elevated. Saw Dr. Raphael Gibney today in the office and he told her tonight for a procedure after seeing fluid on an ultrasound today that could be blood. Denies any bleeding vaginally. Pt. States she has pain in her lower left abdomen.

## 2013-09-10 NOTE — Transfer of Care (Signed)
Immediate Anesthesia Transfer of Care Note  Patient: Carrie Coffey  Procedure(s) Performed: Procedure(s): LAPAROSCOPY OPERATIVE (N/A) UNILATERAL SALPINGECTOMY (Left)  Patient Location: PACU  Anesthesia Type:General  Level of Consciousness: awake, alert  and oriented  Airway & Oxygen Therapy: Patient Spontanous Breathing and Patient connected to nasal cannula oxygen  Post-op Assessment: Report given to PACU RN and Post -op Vital signs reviewed and stable  Post vital signs: Reviewed and stable  Complications: No apparent anesthesia complications

## 2013-09-10 NOTE — MAU Note (Signed)
PT SAYS SHE WAS DX WITH ECTOPIC-  WAS GIVE METHOTREXATE- IN JAN-      SO SHE WENT  AGAIN TO OFFICE  TODAY- U/S-  SAW FLUID-     TOLD TO COME HERE FOR SURGERY.  HAS PAIN IN LOWEER LEFT ABD- .   NO MEDS .

## 2013-09-10 NOTE — H&P (Signed)
Carrie Coffey is an 28 y.o. female. [id number] @ [redacted]w[redacted]d with known ectopic pregnancy.  The patient has been followed at [facility] with medical management.  She has received Methotrexate x 2 and has been followed by serial hCG levels.  While her initial hCG level had been dropping, the most recent hCG showed a slight elevation.  An ultrasound was performed today that showed moderate free fluid in the cul-de-sac concerning for rupture.  Pt is also reporting moderate pain, rating the pain a 4/10.  She reports some nausea, no vomiting.  Pt has not eaten anything today except for some crackers around 1330 prior to her appointment with Dr. Raphael Gibney today.  Pt reports no recent vaginal bleeding.     OBhx: TAB x 2 with D&C for each SAB x 2 no surgery  Past Medical History  Diagnosis Date  . BV (bacterial vaginosis)   . H/O joint problems   . Psoriasis     Past Surgical History  Procedure Laterality Date  . Dilation and curettage of uterus      History reviewed. No pertinent family history.  Social History:  reports that she has never smoked. She has never used smokeless tobacco. She reports that she does not drink alcohol or use illicit drugs.  Allergies:  Allergies  Allergen Reactions  . Fruit & Vegetable Daily [Nutritional Supplements]     Apples   . Pseudoephedrine Hives    Prescriptions prior to admission  Medication Sig Dispense Refill  . acetaminophen (TYLENOL) 500 MG tablet Take 1,000 mg by mouth every 6 (six) hours as needed for moderate pain.      . clonazePAM (KLONOPIN) 0.5 MG tablet Take 0.5 mg by mouth 2 (two) times daily as needed for anxiety.      Marland Kitchen HYDROcodone-acetaminophen (NORCO) 5-325 MG per tablet Take 1 tablet by mouth every 6 (six) hours as needed for moderate pain.  30 tablet  0    Review of Systems  Constitutional: Negative for fever and chills.  Eyes: Negative for blurred vision.  Respiratory: Negative for shortness of breath.   Cardiovascular: Negative for chest  pain and palpitations.  Gastrointestinal: Positive for nausea. Negative for vomiting.  Genitourinary: Negative for dysuria.  Skin: Negative for itching and rash.  Neurological: Positive for headaches. Negative for dizziness.    Blood pressure 118/45, pulse 80, temperature 98.4 F (36.9 C), temperature source Oral, resp. rate 18, height 5\' 1"  (1.549 m), weight 63.674 kg (140 lb 6 oz), last menstrual period 07/18/2012. Physical Exam  Constitutional: She is oriented to person, place, and time. She appears well-developed and well-nourished.  HENT:  Head: Normocephalic.  Cardiovascular: Normal rate and regular rhythm.   Respiratory: Breath sounds normal.  GI: She exhibits no distension. There is tenderness. There is rebound and guarding.  Genitourinary:  Normal external genital, vaginal mucosa pink, no bleeding.  Fullness appreciated in posterior cul-de-sac along with tenderness on exam.  and tenderness appreciated on bimanual exam  Musculoskeletal: Normal range of motion.  Neurological: She is alert and oriented to person, place, and time.  Skin: Skin is warm and dry.  Psychiatric: She has a normal mood and affect.    LABS:  See orders HCG levels: 3/2: 190 2/23: 156 2/16: 199  09/10/13: [place] : Left adnexa with heterogenous mass ~3cm, moderate free fluid in cul-de-sac.    Assessment/Plan: 28yo G5P0040 @ [redacted]w[redacted]d with ectopic pregnancy concern for possible ruptured -Plan for Diagnostic laparoscopy, removal of ectopic, possible salpingectomy -NPO -LR @ 125cc/hr -CBC,  T&S ordered -Reviewed R/B/I and alternatives to procedure including risk of bleeding, infection and injury to surrounding organs requiring further intervention.  Questions and concerns discussed with patient, inform consent obtained.  Janyth Pupa, M 09/10/2013, 8:41 PM

## 2013-09-10 NOTE — Anesthesia Preprocedure Evaluation (Signed)
Anesthesia Evaluation  Patient identified by MRN, date of birth, ID band Patient awake    Reviewed: Allergy & Precautions, H&P , NPO status , Patient's Chart, lab work & pertinent test results  Airway Mallampati: I TM Distance: >3 FB Neck ROM: full    Dental  (+) Teeth Intact   Pulmonary neg pulmonary ROS,    Pulmonary exam normal       Cardiovascular negative cardio ROS      Neuro/Psych negative neurological ROS  negative psych ROS   GI/Hepatic negative GI ROS, Neg liver ROS,   Endo/Other  negative endocrine ROS  Renal/GU negative Renal ROS  negative genitourinary   Musculoskeletal negative musculoskeletal ROS (+)   Abdominal Normal abdominal exam  (+)   Peds  Hematology negative hematology ROS (+)   Anesthesia Other Findings   Reproductive/Obstetrics negative OB ROS                           Anesthesia Physical Anesthesia Plan  ASA: II  Anesthesia Plan: General   Post-op Pain Management:    Induction: Intravenous  Airway Management Planned: Oral ETT  Additional Equipment:   Intra-op Plan:   Post-operative Plan: Extubation in OR  Informed Consent: I have reviewed the patients History and Physical, chart, labs and discussed the procedure including the risks, benefits and alternatives for the proposed anesthesia with the patient or authorized representative who has indicated his/her understanding and acceptance.   Dental Advisory Given  Plan Discussed with: CRNA and Surgeon  Anesthesia Plan Comments:         Anesthesia Quick Evaluation

## 2013-09-10 NOTE — Anesthesia Procedure Notes (Signed)
Procedure Name: Intubation Date/Time: 09/10/2013 9:44 PM Performed by: Isadora Delorey, Sheron Nightingale Pre-anesthesia Checklist: Patient identified, Timeout performed, Emergency Drugs available and Suction available Patient Re-evaluated:Patient Re-evaluated prior to inductionOxygen Delivery Method: Circle system utilized Preoxygenation: Pre-oxygenation with 100% oxygen Intubation Type: IV induction Ventilation: Mask ventilation without difficulty Laryngoscope Size: Miller and 2 Grade View: Grade II Tube type: Oral Number of attempts: 2 Placement Confirmation: ETT inserted through vocal cords under direct vision,  breath sounds checked- equal and bilateral and positive ETCO2 Secured at: 22 cm Dental Injury: Teeth and Oropharynx as per pre-operative assessment

## 2013-09-10 NOTE — Op Note (Signed)
PREOPERATIVE DIAGNOSIS:  Ectopic pregnancy POSTOPERATIVE DIAGNOSIS: same PROCEDURE PERFORMED: Diagnostic laparoscopy, evacuation of hemoperitoneum, left salpingectomy with removal of ectopic pregnancy SURGEON: Dr. Janyth Pupa ANESTHESIA: General endotracheal. ESTIMATED BLOOD LOSS: 100cc.  URINE OUTPUT: 100cc of clear yellow urine at the end of the procedure.  IV FLUIDS: 1600cc of crystalloid.  SPECIMEN(S): 1) left fallopian tube with ectopic pregnancy COMPLICATIONS: None.  CONDITION: Stable.  FINDINGS: 100cc of blood noted within the pelvis upon entry into the abdomen. Uterus normal size and shape.  Right tube and ovary unremarkable.  Left fallopian tube with rupture ectopic at the distal portion of the tube.  Normal left ovary.    Informed consent was obtained from the patient prior to taking her to the operating room where anesthesia was found to be adequate. She was placed in dorsal lithotomy position. She was prepped and draped in normal sterile fashion. The bladder was catheterized with a foley under sterile technique. A bi-valve speculum was then placed and the anterior lip of the cervix was grasped with the single tooth tenaculum. The [place]ka uterine manipulator was then advanced into the uterus to provide uterine mobility. The speculum and tenaculum were then removed.  Attention was then turned to the patients abdomen where a 10 mm infraumbilical skin incision was made with the scalpel. The veress needle was carefully introduced into the peritoneal cavity while tenting the abdominal wall. Intraperitoneal placement was confirmed by use of a saline-drop test.  The gas was connected and confirmed intrabdominal placement by a low initial pressure of 43mmHg. The abdomen was then insuflated with CO2 gas. The trocar and sleeve were then advanced into the abdomen under direct visualization. Intraabdominal placement was confirmed by the laparoscope and surveillance of the abdomen was performed with  findings as above. Two additional 45mm skin incision were made in the left and right lower quadrants with placement of the trocar under direct visualization.   Irrigation of the blood was performed and the ectopic was noted to be ruptured and actively bleeding.  The left fallopian tube was then placed on tension and using the Harmonic, the left tube with ectopic clamped and ligated. Serial ligations were used for full dissection of the left fallopian tube. The endocatch bag was then placed through the 10 mm port and the specimens was removed in its entirety. Copious suction and irrigation was performed to remove all blood.  Re-examination of the left adnexa and abdominal cavity confirmed hemostasis.  The instruments were then removed from the patients abdomen under visualization and the air allowed to fully escape.The fascia was closed using 0-vicryl without difficulty.  The infraumbilical port site was closed with monocryl and the other sites with dermabond. The manipulator and foley catheter was then removed no lacerations or bleeding identified. The patient tolerated the procedure well with all sponge, lap, and needle counts correct. The patient was taken to recovery in stable condition.   Janyth Pupa, DO [phone] (pager) [phone] (office)

## 2013-09-10 NOTE — Discharge Instructions (Signed)
HOME INSTRUCTIONS  Please note any unusual or excessive bleeding, pain, swelling. Mild dizziness or drowsiness are normal for about 24 hours after surgery.   Shower when comfortable  Restrictions: No driving for 24 hours or while taking pain medications.  Activity:  No heavy lifting (> 10 lbs), nothing in vagina (no tampons, douching, or intercourse) x 4 weeks; no tub baths for 4 weeks Vaginal spotting is expected but if your bleeding is heavy, period like,  please call the office   Incision: the bandaids will fall off when they are ready to; you may clean your incision with mild soap and water but do not rub or scrub the incision site.  You may experience slight bloody drainage from your incision periodically.  This is normal.  If you experience a large amount of drainage or the incision opens, please call your physician who will likely direct you to the emergency department.  Diet:  You may eat whatever you want.  Do not eat large meals.  Eat small frequent meals throughout the day.  Continue to drink a good amount of water at least 6-8 glasses of water per day, hydration is very important for the healing process.  Pain Management: Take Motrin and/or Percocet as prescribed/needed for pain.  Percocet may cause constipation- please use over the counter Colace as needed.  Always take prescription pain medication with food, it may cause constipation, increase fluids and fiber and you may want to take an over-the-counter stool softener like Colace as needed up to 2x a day.    Alcohol -- Avoid for 24 hours and while taking pain medications.  Nausea: Take sips of ginger ale or soda  Fever -- Call physician if temperature over 101 degrees  ollow up:  If you do not already have a follow up appointment scheduled, please call the office at [phone].  If you experience fever (a temperature greater than 100.4), pain unrelieved by pain medication, shortness of breath, swelling of a single leg, or  any other symptoms which are concerning to you please the office immediately.

## 2013-09-11 ENCOUNTER — Encounter (HOSPITAL_COMMUNITY): Payer: Self-pay | Admitting: Obstetrics & Gynecology

## 2013-09-11 MED ORDER — OXYCODONE-ACETAMINOPHEN 5-325 MG PO TABS
ORAL_TABLET | ORAL | Status: AC
  Filled 2013-09-11: qty 2

## 2013-09-11 MED ORDER — OXYCODONE-ACETAMINOPHEN 5-325 MG PO TABS
2.0000 | ORAL_TABLET | ORAL | Status: DC | PRN
  Administered 2013-09-11: 2 via ORAL

## 2013-12-17 LAB — OB RESULTS CONSOLE HIV ANTIBODY (ROUTINE TESTING): HIV: NONREACTIVE

## 2013-12-17 LAB — OB RESULTS CONSOLE RPR: RPR: NONREACTIVE

## 2013-12-17 LAB — OB RESULTS CONSOLE RUBELLA ANTIBODY, IGM: Rubella: IMMUNE

## 2013-12-17 LAB — OB RESULTS CONSOLE HEPATITIS B SURFACE ANTIGEN: Hepatitis B Surface Ag: NEGATIVE

## 2013-12-17 LAB — OB RESULTS CONSOLE GC/CHLAMYDIA
CHLAMYDIA, DNA PROBE: NEGATIVE
GC PROBE AMP, GENITAL: NEGATIVE

## 2014-02-05 ENCOUNTER — Inpatient Hospital Stay (HOSPITAL_COMMUNITY)
Admission: AD | Admit: 2014-02-05 | Payer: BC Managed Care – PPO | Source: Ambulatory Visit | Admitting: Obstetrics and Gynecology

## 2014-04-29 LAB — OB RESULTS CONSOLE HIV ANTIBODY (ROUTINE TESTING): HIV: NONREACTIVE

## 2014-05-10 ENCOUNTER — Encounter (HOSPITAL_COMMUNITY): Payer: Self-pay | Admitting: Obstetrics & Gynecology

## 2014-06-03 ENCOUNTER — Encounter (HOSPITAL_COMMUNITY): Payer: Self-pay | Admitting: *Deleted

## 2014-06-22 LAB — OB RESULTS CONSOLE GBS: GBS: NEGATIVE

## 2014-07-09 NOTE — L&D Delivery Note (Signed)
Delivery Note At 3:40 PM a viable female, "Carrie Coffey)" was delivered via Vaginal, Spontaneous Delivery from hands and knees position (Presentation: Right Occiput Anterior).  APGAR: 9, 9; weight  .   Placenta status: Intact, Spontaneous.  Cord: 3 vessels with the following complications: None.  Cord pH: NA  Waterbirth tub set up completely just prior to delivery, but patient elected to remain in hands and knees position in the bed for delivery.  Anesthesia: None  Episiotomy: None Lacerations: 1st degree vaginal;  Bilateral shallow periurethral lacerations, no repair required. Suture Repair: 3.0 vicryl Est. Blood Loss (mL): 200  1/2 cm skin tag at introitus--removed after delivery per patient request, with single stitch applied at removal site for hemostasis. Specimen to pathology.  Mom to postpartum.  Baby to Couplet care / Skin to Skin. Family plans inpatient circumcision.  Donnel Saxon 07/23/2014, 4:46 PM

## 2014-07-22 ENCOUNTER — Inpatient Hospital Stay (HOSPITAL_COMMUNITY)
Admission: AD | Admit: 2014-07-22 | Discharge: 2014-07-22 | Disposition: A | Discharge status: Home / Self Care | Payer: BC Managed Care – PPO | Source: Ambulatory Visit | Attending: Obstetrics and Gynecology | Admitting: Obstetrics and Gynecology

## 2014-07-22 ENCOUNTER — Encounter (HOSPITAL_COMMUNITY): Payer: Self-pay | Admitting: *Deleted

## 2014-07-22 DIAGNOSIS — O471 False labor at or after 37 completed weeks of gestation: Secondary | ICD-10-CM | POA: Diagnosis not present

## 2014-07-22 DIAGNOSIS — Z3A40 40 weeks gestation of pregnancy: Secondary | ICD-10-CM | POA: Diagnosis not present

## 2014-07-22 MED ORDER — ZOLPIDEM TARTRATE 5 MG PO TABS
5.0000 mg | ORAL_TABLET | Freq: Once | ORAL | Status: AC
  Administered 2014-07-22: 5 mg via ORAL
  Filled 2014-07-22: qty 1

## 2014-07-22 MED ORDER — MORPHINE SULFATE 4 MG/ML IJ SOLN
4.0000 mg | Freq: Once | INTRAMUSCULAR | Status: AC
  Administered 2014-07-22: 4 mg via INTRAMUSCULAR
  Filled 2014-07-22: qty 1

## 2014-07-22 NOTE — MAU Provider Note (Signed)
History    Carrie Coffey is a 29 y.o. G6P0050 at 40.4wks who presents, after phone call, for contractions.  Patient states that she was seen in the office this morning and had her membranes stripped.  Patient states that she started contracting around 1pm.  They become more frequent, about every 4-5 minutes, around 1730.  Patient reports active fetus and some bloody show (which appeared after membrane stripping) but denies LOF.  Patient does desire waterbirth.    Patient Active Problem List   Diagnosis Date Noted  . Ectopic pregnancy 08/01/2013    No chief complaint on file.  HPI  OB History    Gravida Para Term Preterm AB TAB SAB Ectopic Multiple Living   5    4 2 2    0      Past Medical History  Diagnosis Date  . BV (bacterial vaginosis)   . H/O joint problems   . Psoriasis     Past Surgical History  Procedure Laterality Date  . Dilation and curettage of uterus    . Laparoscopy N/A 09/10/2013    Procedure: LAPAROSCOPY OPERATIVE;  Surgeon: Annalee Genta, DO;  Location: [facility];  Service: Gynecology;  Laterality: N/A;  . Unilateral salpingectomy Left 09/10/2013    Procedure: UNILATERAL SALPINGECTOMY;  Surgeon: Annalee Genta, DO;  Location: [facility];  Service: Gynecology;  Laterality: Left;    No family history on file.  History  Substance Use Topics  . Smoking status: Never Smoker   . Smokeless tobacco: Never Used  . Alcohol Use: No    Allergies:  Allergies  Allergen Reactions  . Fruit & Vegetable Daily [Nutritional Supplements]     Apples   . Pseudoephedrine Hives    Prescriptions prior to admission  Medication Sig Dispense Refill Last Dose  . acetaminophen (TYLENOL) 500 MG tablet Take 1,000 mg by mouth every 6 (six) hours as needed for moderate pain.   Past Month at Unknown time  . clonazePAM (KLONOPIN) 0.5 MG tablet Take 0.5 mg by mouth 2 (two) times daily as needed for anxiety.   Past Month at Unknown time  . oxyCODONE-acetaminophen (ROXICET) 5-325 MG per  tablet Take 1-2 tablets by mouth every 6 (six) hours as needed for severe pain. 30 tablet 0     ROS  See HPI Above Physical Exam   unknown if currently breastfeeding.  Physical Exam SVE: 1/90/0 Posterior, Bloody Show FHR: 135 bpm, Mod Var, -Decels, +Accels UC: Q 1-55min, palpates moderate to strong ED Course  Assessment: IUP at 40.4wks Early Labor  Plan: -Await for reactive NST -May ambulate in halls -Will reassess after ambulation  Follow Up (2010) -NST remains non reactive, but reassuring -Patient requests more time for reactive strip -Patient observed holding her breathe and audible decelerations observed -Patient educated on need for infant to get oxygenation through good maternal breathing techniques -Strip reactive with position change and patient being coached through contractions  Follow Up (2200) -SVE remains the same -Patient given options --Overnight observation with access to IV pain medication and sleep aides, intermittent monitoring, and VE as necessary --Therapeutic Rest and discharge to home --Discharged to home -Patient opts for therapeutic rest -Questions and concerns addressed regarding when to report for labor -Encouraged to call and update midwife accordingly, if this brings about comfort -Reassurances given as patient expresses disappointment regarding no change since 37 wks -Educated on membrane stripping, release of prostaglandins, and oxytocin  -Patient discharged to home in early labor  [name], [place],  MSN 07/22/2014 7:11 PM

## 2014-07-22 NOTE — MAU Note (Signed)
No adverse reaction or sensitivities to medications given noted.

## 2014-07-22 NOTE — MAU Note (Signed)
Pt states that contractions started this afternoon around 1pm. States contractions have been every 3-71minutes. Denies LOF/SROM. Some spotting of blood. Denies bright red bleeding. Positive fetal movement. Denies r/f's.

## 2014-07-23 ENCOUNTER — Encounter (HOSPITAL_COMMUNITY): Payer: Self-pay | Admitting: General Practice

## 2014-07-23 ENCOUNTER — Inpatient Hospital Stay (HOSPITAL_COMMUNITY)
Admission: AD | Admit: 2014-07-23 | Discharge: 2014-07-25 | DRG: 775 | Disposition: A | Discharge status: Home / Self Care | Payer: BC Managed Care – PPO | Source: Ambulatory Visit | Attending: Obstetrics and Gynecology | Admitting: Obstetrics and Gynecology

## 2014-07-23 DIAGNOSIS — L918 Other hypertrophic disorders of the skin: Secondary | ICD-10-CM | POA: Diagnosis present

## 2014-07-23 DIAGNOSIS — Z3A40 40 weeks gestation of pregnancy: Secondary | ICD-10-CM | POA: Diagnosis present

## 2014-07-23 DIAGNOSIS — N9089 Other specified noninflammatory disorders of vulva and perineum: Secondary | ICD-10-CM

## 2014-07-23 LAB — CBC
HEMATOCRIT: 41.6 % (ref 36.0–46.0)
Hemoglobin: 13.8 g/dL (ref 12.0–15.0)
MCH: 32.8 pg (ref 26.0–34.0)
MCHC: 33.2 g/dL (ref 30.0–36.0)
MCV: 98.8 fL (ref 78.0–100.0)
Platelets: 247 10*3/uL (ref 150–400)
RBC: 4.21 MIL/uL (ref 3.87–5.11)
RDW: 14.6 % (ref 11.5–15.5)
WBC: 23.1 10*3/uL — ABNORMAL HIGH (ref 4.0–10.5)

## 2014-07-23 LAB — TYPE AND SCREEN
ABO/RH(D): O POS
Antibody Screen: NEGATIVE

## 2014-07-23 MED ORDER — ACETAMINOPHEN 325 MG PO TABS: 650.0000 mg | ORAL_TABLET | ORAL | Status: DC | PRN

## 2014-07-23 MED ORDER — SIMETHICONE 80 MG PO CHEW: 80.0000 mg | CHEWABLE_TABLET | ORAL | Status: DC | PRN

## 2014-07-23 MED ORDER — OXYCODONE-ACETAMINOPHEN 5-325 MG PO TABS: 2.0000 | ORAL_TABLET | ORAL | Status: DC | PRN

## 2014-07-23 MED ORDER — OXYTOCIN 10 UNIT/ML IJ SOLN
INTRAMUSCULAR | Status: AC
  Filled 2014-07-23: qty 1

## 2014-07-23 MED ORDER — IBUPROFEN 600 MG PO TABS
600.0000 mg | ORAL_TABLET | Freq: Four times a day (QID) | ORAL | Status: DC
  Administered 2014-07-23 – 2014-07-25 (×7): 600 mg via ORAL
  Filled 2014-07-23 (×7): qty 1

## 2014-07-23 MED ORDER — OXYCODONE-ACETAMINOPHEN 5-325 MG PO TABS: 1.0000 | ORAL_TABLET | ORAL | Status: DC | PRN

## 2014-07-23 MED ORDER — WITCH HAZEL-GLYCERIN EX PADS
1.0000 "application " | MEDICATED_PAD | CUTANEOUS | Status: DC | PRN
  Administered 2014-07-23: 1 via TOPICAL

## 2014-07-23 MED ORDER — LANOLIN HYDROUS EX OINT: TOPICAL_OINTMENT | CUTANEOUS | Status: DC | PRN

## 2014-07-23 MED ORDER — TETANUS-DIPHTH-ACELL PERTUSSIS 5-2.5-18.5 LF-MCG/0.5 IM SUSP: 0.5000 mL | Freq: Once | INTRAMUSCULAR | Status: DC

## 2014-07-23 MED ORDER — ZOLPIDEM TARTRATE 5 MG PO TABS: 5.0000 mg | ORAL_TABLET | Freq: Every evening | ORAL | Status: DC | PRN

## 2014-07-23 MED ORDER — SENNOSIDES-DOCUSATE SODIUM 8.6-50 MG PO TABS
2.0000 | ORAL_TABLET | ORAL | Status: DC
  Administered 2014-07-23 – 2014-07-24 (×2): 2 via ORAL
  Filled 2014-07-23 (×2): qty 2

## 2014-07-23 MED ORDER — CITRIC ACID-SODIUM CITRATE 334-500 MG/5ML PO SOLN: 30.0000 mL | ORAL | Status: DC | PRN

## 2014-07-23 MED ORDER — PRENATAL MULTIVITAMIN CH
1.0000 | ORAL_TABLET | Freq: Every day | ORAL | Status: DC
  Administered 2014-07-24 – 2014-07-25 (×2): 1 via ORAL
  Filled 2014-07-23 (×2): qty 1

## 2014-07-23 MED ORDER — FLEET ENEMA 7-19 GM/118ML RE ENEM: 1.0000 | ENEMA | RECTAL | Status: DC | PRN

## 2014-07-23 MED ORDER — DIPHENHYDRAMINE HCL 25 MG PO CAPS
25.0000 mg | ORAL_CAPSULE | Freq: Four times a day (QID) | ORAL | Status: DC | PRN
Start: 2014-07-23 — End: 2014-07-25

## 2014-07-23 MED ORDER — LACTATED RINGERS IV SOLN: 500.0000 mL | INTRAVENOUS | Status: DC | PRN

## 2014-07-23 MED ORDER — ONDANSETRON HCL 4 MG/2ML IJ SOLN: 4.0000 mg | INTRAMUSCULAR | Status: DC | PRN

## 2014-07-23 MED ORDER — DIBUCAINE 1 % RE OINT
1.0000 "application " | TOPICAL_OINTMENT | RECTAL | Status: DC | PRN
  Administered 2014-07-23: 1 via RECTAL
  Filled 2014-07-23: qty 28

## 2014-07-23 MED ORDER — BENZOCAINE-MENTHOL 20-0.5 % EX AERO
1.0000 "application " | INHALATION_SPRAY | CUTANEOUS | Status: DC | PRN
  Administered 2014-07-23 – 2014-07-25 (×2): 1 via TOPICAL
  Filled 2014-07-23 (×2): qty 56

## 2014-07-23 MED ORDER — ONDANSETRON HCL 4 MG/2ML IJ SOLN: 4.0000 mg | Freq: Four times a day (QID) | INTRAMUSCULAR | Status: DC | PRN

## 2014-07-23 MED ORDER — LIDOCAINE HCL (PF) 1 % IJ SOLN
30.0000 mL | INTRAMUSCULAR | Status: DC | PRN
  Administered 2014-07-23: 30 mL via SUBCUTANEOUS
  Filled 2014-07-23: qty 30

## 2014-07-23 MED ORDER — ONDANSETRON HCL 4 MG PO TABS: 4.0000 mg | ORAL_TABLET | ORAL | Status: DC | PRN

## 2014-07-23 MED ORDER — BUTORPHANOL TARTRATE 1 MG/ML IJ SOLN: 1.0000 mg | INTRAMUSCULAR | Status: DC | PRN

## 2014-07-23 NOTE — H&P (Signed)
Daina Cara is a 29 y.o. female, [id number] at 2 5/7 weeks, presenting for increased UCs since last night--seen in MAU 1/14 for prodromal labor, with cervix 1 cm.  Home after morphine and Ambien, but did not rest.  No report of leaking of fluid, but perineum appears wet--upon questioning, she reports possible leaking around 11:30a.  Now feeling some pressure in back and rectum, but no strong urge to push.    Patient accompanied by husband, Mliss Fritz, and doula, Loma Sousa.  Planning use of waterbirth tub--tub is being brought by another doula, Shade Flood, but not here yet.  Problem List: Hx ectopic 2015 Hx SAB x 2, TAB x 2 Vulvar skin tag  History of present pregnancy: Patient entered care at 9 4/7 weeks.   EDC of 07/18/14 was established by LMP and confirmed by [place] at 12 weeks.   Anatomy scan:  20 4/7 weeks, with normal findings and an posterior placenta. Additional [place] evaluations:  None Significant prenatal events:  Normal 1st trimester screen and AFP.  TDAP 05/13/14.  Had episodes of generalized itching, with normal bile acids.  Elected to pursue waterbirth--attended class, signed consents x 2.  Membranes swept 07/20/14.  Vulvar skin tag noted during pregnancy--requested consideration of removal at delivery. Last evaluation:  07/22/14--cervix 1 cm, 80%, membranes swept.  OB History    Gravida Para Term Preterm AB TAB SAB Ectopic Multiple Living   6    5 2 2 1   0    2005--TAB 2009--SAB 2011--TAB 2014--SAB 08/2013--left ectopic, salpingectomy due to ruptured ectopic s/p MTX, Dr. Nelda Marseille for [place].   Past Medical History  Diagnosis Date  . BV (bacterial vaginosis)   . H/O joint problems   . Psoriasis    Past Surgical History  Procedure Laterality Date  . Dilation and curettage of uterus    . Laparoscopy N/A 09/10/2013    Procedure: LAPAROSCOPY OPERATIVE;  Surgeon: Annalee Genta, DO;  Location: [facility];  Service: Gynecology;  Laterality: N/A;  . Unilateral salpingectomy Left 09/10/2013     Procedure: UNILATERAL SALPINGECTOMY;  Surgeon: Annalee Genta, DO;  Location: [facility];  Service: Gynecology;  Laterality: Left;   Family History: No significant family hx.  Social History:  reports that she has never smoked. She has never used smokeless tobacco. She reports that she does not drink alcohol or use illicit drugs.  Patient is Sales promotion account executive, of the [employer] [place], married to [place], has post-graduate education, employed as Social worker.   Prenatal Transfer Tool  Maternal Diabetes: No Genetic Screening: Normal Maternal Ultrasounds/Referrals: Normal Fetal Ultrasounds or other Referrals:  None Maternal Substance Abuse:  No Significant Maternal Medications:  None Significant Maternal Lab Results: Lab values include: Group B Strep negative  TDAP 05/13/14   ROS:  Contractions, bloody show, +FM, ? wetness  Allergies  Allergen Reactions  . Fruit & Vegetable Daily [Nutritional Supplements] Hives and Swelling    Apples, pears, strawberries and any food containing birch-tree pollen.  . Pseudoephedrine Hives     Dilation: 10 Effacement (%): 100 Station: +1 Exam by:: vickie Epsie Walthall, cnm Blood pressure 130/66, pulse 103, temperature 98.2 F (36.8 C), temperature source Oral, resp. rate 18, last menstrual period 10/11/2013, unknown if currently breastfeeding.  Chest clear Heart RRR without murmur Abd gravid, NT, FH 38 Pelvic: As above, with fetal vtx visible on speculum exam.  Evidence of previous ROM, clear fluid 1/2 cm skin tag noted on vulva at introitus--on pedicle. Ext: WNL  FHR: Category 1 UCs:  3-4 min,  moderate  Prenatal labs: ABO, Rh: --/--/O POS (01/15 1438) Antibody: NEG (01/15 1438) Rubella:   Immune RPR: Nonreactive (06/11 0000)  HBsAg: Negative (06/11 0000)  HIV: Non-reactive (10/22 0000)  GBS: Negative (12/15 0000) Sickle cell/Hgb electrophoresis:  AA Pap:  2013 WNL GC:  Negative 12/17/13 Chlamydia:  Negative 12/17/13 Genetic screenings:  Normal  1st trimester screen and AFP Glucola:  WNL Other:   Hgb 12.9 at NOB, 12.5 at 28 weeks Bile acids WNL 06/22/14    Assessment/Plan: IUP at 40 5/7 weeks 2nd stage labor GBS negative Desires use of waterbirth tub--consents signed. SROM at some point today  Plan: Admit to [place] per consult with Dr. Charlesetta Garibaldi Routine [facility] orders OK for use of waterbirth tub Will assess for removal of vulvar skin tag at delivery.   [place], [place], [place] 07/23/2014, 2:15p

## 2014-07-23 NOTE — MAU Note (Signed)
Pt presents complaining of worsening contractions. Was evaluated in MAU and 1cm. Denies leaking. Reports some vaginal bleeding after having membranes swept yesterday. Reports good fetal movement.

## 2014-07-23 NOTE — Progress Notes (Signed)
1540, Delivery of live viable female by V. Cira Servant, CNM APGARS [phone]

## 2014-07-24 LAB — CBC
HCT: 36.1 % (ref 36.0–46.0)
HEMOGLOBIN: 12.2 g/dL (ref 12.0–15.0)
MCH: 32.8 pg (ref 26.0–34.0)
MCHC: 33.8 g/dL (ref 30.0–36.0)
MCV: 97 fL (ref 78.0–100.0)
Platelets: 207 10*3/uL (ref 150–400)
RBC: 3.72 MIL/uL — ABNORMAL LOW (ref 3.87–5.11)
RDW: 14.3 % (ref 11.5–15.5)
WBC: 18.7 10*3/uL — ABNORMAL HIGH (ref 4.0–10.5)

## 2014-07-24 NOTE — Lactation Note (Signed)
This note was copied from the chart of [place]. Lactation Consultation Note Called by mom to check latch. Mom had baby latched in cradle hold when I went into room. Assisted her with pillows to get comfortable. Mom reports no pain with latch. No further questions at present. To call prn  Patient Name: Carrie Coffey [id number] Date: 07/24/2014 Reason for consult: Follow-up assessment   Maternal Data Formula Feeding for Exclusion: No Has patient been taught Hand Expression?: Yes Does the patient have breastfeeding experience prior to this delivery?: No  Feeding Feeding Type: Breast Fed  LATCH Score/Interventions Latch: Grasps breast easily, tongue down, lips flanged, rhythmical sucking.  Audible Swallowing: A few with stimulation  Type of Nipple: Everted at rest and after stimulation  Comfort (Breast/Nipple): Soft / non-tender     Hold (Positioning): No assistance needed to correctly position infant at breast. Intervention(s): Support Pillows  LATCH Score: 9  Lactation Tools Discussed/Used     Consult Status Consult Status: PRN Date: 07/25/14 Follow-up type: In-patient    Truddie Crumble 07/24/2014, 2:22 PM

## 2014-07-24 NOTE — Progress Notes (Signed)
PROGRESS NOTE  I have reviewed the patient's vital signs, labs, and notes. I have evaluated the patient. I agree with the previous note from the Certified Nurse Midwife.  Circumcision discussed. Risks and benefits reviewed. Questions answered. Permit signed.  Gildardo Cranker, M.D. 07/24/2014

## 2014-07-24 NOTE — Progress Notes (Addendum)
Subjective: Postpartum Day 1: Vaginal delivery, 1st degree vaginal laceration, bilateral shallow periurethral lacerations, no repair required.  Removal of vulvar skin tag at introitus. Patient up ad lib, reports no syncope or dizziness. Feeding:  Breast Contraceptive plan:  Undecided at present  Objective: Vital signs in last 24 hours: Temp:  [98.2 F (36.8 C)-99.1 F (37.3 C)] 98.4 F (36.9 C) (01/16 0500) Pulse Rate:  [63-126] 63 (01/16 0500) Resp:  [16-18] 16 (01/16 0500) BP: (105-149)/(41-74) 105/54 mmHg (01/16 0500) SpO2:  [100 %] 100 % (01/16 0500)  Physical Exam:  General: alert Lochia: appropriate Uterine Fundus: firm Perineum: healing well DVT Evaluation: Negative Homan's sign.    Recent Labs  07/23/14 1438 07/24/14 0545  HGB 13.8 12.2  HCT 41.6 36.1   CBC Latest Ref Rng 07/24/2014 07/23/2014 09/10/2013  WBC 4.0 - 10.5 K/uL 18.7(H) 23.1(H) 8.4  Hemoglobin 12.0 - 15.0 g/dL 12.2 13.8 11.4(L)  Hematocrit 36.0 - 46.0 % 36.1 41.6 34.1(L)  Platelets 150 - 400 K/uL 207 247 362     Assessment/Plan: Status post vaginal delivery day 1. Stable Continue current care. Resolving leukocytosis without fever. Plan for discharge tomorrow  Discussed contraceptive decision-making and options for use during breastfeeding.    Allena Katz 07/24/2014, 8:24 AM

## 2014-07-24 NOTE — Lactation Note (Signed)
This note was copied from the chart of [place]. Lactation Consultation Note: Initial visit with mom. She reports that baby has been nursing well but was circ'd this morning. Reports she just tried to nurse but he was too sleepy. Bf brochure given. Reviewed OP appointments and [facility] as resources for support after DC.Asking about pumping- has manual pump at bedside and plans to get DEBP from insurance company. Encouraged frequent breast feeding to promote a good milk supply. No further questions at present. To call for assist prn  Patient Name: Carrie Coffey Today's Date: 07/24/2014 Reason for consult: Initial assessment   Maternal Data Formula Feeding for Exclusion: No Has patient been taught Hand Expression?: Yes Does the patient have breastfeeding experience prior to this delivery?: No  Feeding    LATCH Score/Interventions                      Lactation Tools Discussed/Used     Consult Status Consult Status: Follow-up Date: 07/25/14 Follow-up type: In-patient    Truddie Crumble 07/24/2014, 11:52 AM

## 2014-07-25 MED ORDER — IBUPROFEN 600 MG PO TABS: 600.0000 mg | ORAL_TABLET | Freq: Four times a day (QID) | ORAL | Status: DC

## 2014-07-25 NOTE — Lactation Note (Signed)
This note was copied from the chart of [place]. Lactation Consultation Note Mom reports that BF is going well. Reviewed hand expression with colostrum easily expressed. Mom denies any questions. Aware of support group and outpatient services. Patient Name: Carrie Coffey Today's Date: 07/25/2014     Maternal Data    Feeding Feeding Type: Breast Fed Length of feed: 25 min  LATCH Score/Interventions                      Lactation Tools Discussed/Used     Consult Status      Van Clines 07/25/2014, 1:00 PM

## 2014-07-25 NOTE — Discharge Summary (Signed)
Vaginal Delivery Discharge Summary  Carrie Coffey  DOB:    1985/09/27 MRN:    [id number] CSN:    [id number]  Date of admission:                  07/23/2014  Date of discharge:                   07/25/2014  Procedures this admission:   SVD with 1st Degree Laceration and Removal of Vulvar Skin Tag  Date of Delivery: 07/23/2014  Newborn Data:  Live born female  Birth Weight: 7 lb 14 oz (3572 g) APGAR: 9, 9  Home with mother. Name: [name]  Circumcision Plan: Inpatient  History of Present Illness:  Ms. Carrie Coffey is a 29 y.o. female, [phone], who presents at [redacted]w[redacted]d weeks gestation. The patient has been followed at the [facility]. She was admitted onset of labor. Her pregnancy has been complicated by:  Patient Active Problem List   Diagnosis Date Noted  . First degree perineal laceration during delivery 07/25/2014  . Vaginal delivery 07/23/2014  . Skin tag of vulva 07/23/2014  . Ectopic pregnancy 08/01/2013     Hospital Course:  Admitted for active labor. Negative GBS. Progressed  naturally. Utilized coping and breathing mechanisms for pain management.  Delivery was performed by V.Cira Servant, CNM without complication. Patient and baby tolerated the procedure without difficulty, with 1st degree laceration noted. Infant status was stable and remained in room with mother.  Mother and infant then had an uncomplicated postpartum course, with breastfeeding going well. Mom's physical exam was WNL, and she was discharged home in stable condition. Contraception plan was undecided.  She received adequate benefit from po pain medications.   Feeding:  breast  Contraception:  Undecided  Discharge hemoglobin:  HEMOGLOBIN  Date Value Ref Range Status  07/24/2014 12.2 12.0 - 15.0 g/dL Final   HCT  Date Value Ref Range Status  07/24/2014 36.1 36.0 - 46.0 % Final    Discharge Physical Exam:   General: alert,  cooperative and no distress  Chest: HRRR, Lungs CTA Abdomen: Soft, NT Uterine Fundus: firm, U/-2 Lochia: appropriate Incision: N/A DVT Evaluation: No evidence of DVT seen on physical exam. No significant calf/ankle edema.  Intrapartum Procedures: spontaneous vaginal delivery Postpartum Procedures: none Complications-Operative and Postpartum: 1st degree perineal laceration  Discharge Diagnoses: Term Pregnancy-delivered  Discharge Information:  Activity:           pelvic rest Diet:                routine Medications: Ibuprofen and Iron Condition:      stable Instructions:  Pain Management, Peri-Care, Breastfeeding, Who and When to call for postpartum complications. Information Sheet(s) given PP BB and Depression, Contraception Choices   Discharge to: home  Follow-up Information    Follow up with [facility]. Schedule an appointment as soon as possible for a visit in 5 weeks.   Specialty:  Obstetrics and Gynecology   Why:  Please call if you have any questions or concerns prior to your next visit.   Contact information:   [address] [place] [address] [phone]       Sedalia Muta MSN, CNM 07/25/2014 8:19 AM

## 2014-07-25 NOTE — Discharge Instructions (Signed)
Contraception Choices Contraception (birth control) is the use of any methods or devices to prevent pregnancy. Below are some methods to help avoid pregnancy. HORMONAL METHODS   Contraceptive implant. This is a thin, plastic tube containing progesterone hormone. It does not contain estrogen hormone. Your health care provider inserts the tube in the inner part of the upper arm. The tube can remain in place for up to 3 years. After 3 years, the implant must be removed. The implant prevents the ovaries from releasing an egg (ovulation), thickens the cervical mucus to prevent sperm from entering the uterus, and thins the lining of the inside of the uterus.  Progesterone-only injections. These injections are given every 3 months by your health care provider to prevent pregnancy. This synthetic progesterone hormone stops the ovaries from releasing eggs. It also thickens cervical mucus and changes the uterine lining. This makes it harder for sperm to survive in the uterus.  Birth control pills. These pills contain estrogen and progesterone hormone. They work by preventing the ovaries from releasing eggs (ovulation). They also cause the cervical mucus to thicken, preventing the sperm from entering the uterus. Birth control pills are prescribed by a health care provider.Birth control pills can also be used to treat heavy periods.  Minipill. This type of birth control pill contains only the progesterone hormone. They are taken every day of each month and must be prescribed by your health care provider.  Birth control patch. The patch contains hormones similar to those in birth control pills. It must be changed once a week and is prescribed by a health care provider.  Vaginal ring. The ring contains hormones similar to those in birth control pills. It is left in the vagina for 3 weeks, removed for 1 week, and then a new one is put back in place. The patient must be comfortable inserting and removing the ring  from the vagina.A health care provider's prescription is necessary.  Emergency contraception. Emergency contraceptives prevent pregnancy after unprotected sexual intercourse. This pill can be taken right after sex or up to 5 days after unprotected sex. It is most effective the sooner you take the pills after having sexual intercourse. Most emergency contraceptive pills are available without a prescription. Check with your pharmacist. Do not use emergency contraception as your only form of birth control. BARRIER METHODS   Female condom. This is a thin sheath (latex or rubber) that is worn over the penis during sexual intercourse. It can be used with spermicide to increase effectiveness.  Female condom. This is a soft, loose-fitting sheath that is put into the vagina before sexual intercourse.  Diaphragm. This is a soft, latex, dome-shaped barrier that must be fitted by a health care provider. It is inserted into the vagina, along with a spermicidal jelly. It is inserted before intercourse. The diaphragm should be left in the vagina for 6 to 8 hours after intercourse.  Cervical cap. This is a round, soft, latex or plastic cup that fits over the cervix and must be fitted by a health care provider. The cap can be left in place for up to 48 hours after intercourse.  Sponge. This is a soft, circular piece of polyurethane foam. The sponge has spermicide in it. It is inserted into the vagina after wetting it and before sexual intercourse.  Spermicides. These are chemicals that kill or block sperm from entering the cervix and uterus. They come in the form of creams, jellies, suppositories, foam, or tablets. They do not require a  prescription. They are inserted into the vagina with an applicator before having sexual intercourse. The process must be repeated every time you have sexual intercourse. INTRAUTERINE CONTRACEPTION  Intrauterine device (IUD). This is a T-shaped device that is put in a woman's uterus  during a menstrual period to prevent pregnancy. There are 2 types:  Copper IUD. This type of IUD is wrapped in copper wire and is placed inside the uterus. Copper makes the uterus and fallopian tubes produce a fluid that kills sperm. It can stay in place for 10 years.  Hormone IUD. This type of IUD contains the hormone progestin (synthetic progesterone). The hormone thickens the cervical mucus and prevents sperm from entering the uterus, and it also thins the uterine lining to prevent implantation of a fertilized egg. The hormone can weaken or kill the sperm that get into the uterus. It can stay in place for 3-5 years, depending on which type of IUD is used. PERMANENT METHODS OF CONTRACEPTION  Female tubal ligation. This is when the woman's fallopian tubes are surgically sealed, tied, or blocked to prevent the egg from traveling to the uterus.  Hysteroscopic sterilization. This involves placing a small coil or insert into each fallopian tube. Your doctor uses a technique called hysteroscopy to do the procedure. The device causes scar tissue to form. This results in permanent blockage of the fallopian tubes, so the sperm cannot fertilize the egg. It takes about 3 months after the procedure for the tubes to become blocked. You must use another form of birth control for these 3 months.  Female sterilization. This is when the female has the tubes that carry sperm tied off (vasectomy).This blocks sperm from entering the vagina during sexual intercourse. After the procedure, the man can still ejaculate fluid (semen). NATURAL PLANNING METHODS  Natural family planning. This is not having sexual intercourse or using a barrier method (condom, diaphragm, cervical cap) on days the woman could become pregnant.  Calendar method. This is keeping track of the length of each menstrual cycle and identifying when you are fertile.  Ovulation method. This is avoiding sexual intercourse during ovulation.  Symptothermal  method. This is avoiding sexual intercourse during ovulation, using a thermometer and ovulation symptoms.  Post-ovulation method. This is timing sexual intercourse after you have ovulated. Regardless of which type or method of contraception you choose, it is important that you use condoms to protect against the transmission of sexually transmitted infections (STIs). Talk with your health care provider about which form of contraception is most appropriate for you. Document Released: 06/25/2005 Document Revised: 06/30/2013 Document Reviewed: 12/18/2012 [facility] Patient Information 2015 [place], [place]. This information is not intended to replace advice given to you by your health care provider. Make sure you discuss any questions you have with your health care provider. Postpartum Depression and Baby Blues The postpartum period begins right after the birth of a baby. During this time, there is often a great amount of joy and excitement. It is also a time of many changes in the life of the parents. Regardless of how many times a mother gives birth, each child brings new challenges and dynamics to the family. It is not unusual to have feelings of excitement along with confusing shifts in moods, emotions, and thoughts. All mothers are at risk of developing postpartum depression or the "baby blues." These mood changes can occur right after giving birth, or they may occur many months after giving birth. The baby blues or postpartum depression can be mild or  severe. Additionally, postpartum depression can go away rather quickly, or it can be a long-term condition.  CAUSES Raised hormone levels and the rapid drop in those levels are thought to be a main cause of postpartum depression and the baby blues. A number of hormones change during and after pregnancy. Estrogen and progesterone usually decrease right after the delivery of your baby. The levels of thyroid hormone and various cortisol steroids also rapidly drop.  Other factors that play a role in these mood changes include major life events and genetics.  RISK FACTORS If you have any of the following risks for the baby blues or postpartum depression, know what symptoms to watch out for during the postpartum period. Risk factors that may increase the likelihood of getting the baby blues or postpartum depression include:  Having a personal or family history of depression.   Having depression while being pregnant.   Having premenstrual mood issues or mood issues related to oral contraceptives.  Having a lot of life stress.   Having marital conflict.   Lacking a social support network.   Having a baby with special needs.   Having health problems, such as diabetes.  SIGNS AND SYMPTOMS Symptoms of baby blues include:  Brief changes in mood, such as going from extreme happiness to sadness.  Decreased concentration.   Difficulty sleeping.   Crying spells, tearfulness.   Irritability.   Anxiety.  Symptoms of postpartum depression typically begin within the first month after giving birth. These symptoms include:  Difficulty sleeping or excessive sleepiness.   Marked weight loss.   Agitation.   Feelings of worthlessness.   Lack of interest in activity or food.  Postpartum psychosis is a very serious condition and can be dangerous. Fortunately, it is rare. Displaying any of the following symptoms is cause for immediate medical attention. Symptoms of postpartum psychosis include:   Hallucinations and delusions.   Bizarre or disorganized behavior.   Confusion or disorientation.  DIAGNOSIS  A diagnosis is made by an evaluation of your symptoms. There are no medical or lab tests that lead to a diagnosis, but there are various questionnaires that a health care provider may use to identify those with the baby blues, postpartum depression, or psychosis. Often, a screening tool called the [place] Postnatal Depression  Scale is used to diagnose depression in the postpartum period.  TREATMENT The baby blues usually goes away on its own in 1-2 weeks. Social support is often all that is needed. You will be encouraged to get adequate sleep and rest. Occasionally, you may be given medicines to help you sleep.  Postpartum depression requires treatment because it can last several months or longer if it is not treated. Treatment may include individual or group therapy, medicine, or both to address any social, physiological, and psychological factors that may play a role in the depression. Regular exercise, a healthy diet, rest, and social support may also be strongly recommended.  Postpartum psychosis is more serious and needs treatment right away. Hospitalization is often needed. HOME CARE INSTRUCTIONS  Get as much rest as you can. Nap when the baby sleeps.   Exercise regularly. Some women find yoga and walking to be beneficial.   Eat a balanced and nourishing diet.   Do little things that you enjoy. Have a cup of tea, take a bubble bath, read your favorite magazine, or listen to your favorite music.  Avoid alcohol.   Ask for help with household chores, cooking, grocery shopping, or running errands as  needed. Do not try to do everything.   Talk to people close to you about how you are feeling. Get support from your partner, family members, friends, or other new moms.  Try to stay positive in how you think. Think about the things you are grateful for.   Do not spend a lot of time alone.   Only take over-the-counter or prescription medicine as directed by your health care provider.  Keep all your postpartum appointments.   Let your health care provider know if you have any concerns.  SEEK MEDICAL CARE IF: You are having a reaction to or problems with your medicine. SEEK IMMEDIATE MEDICAL CARE IF:  You have suicidal feelings.   You think you may harm the baby or someone else. MAKE SURE  YOU:  Understand these instructions.  Will watch your condition.  Will get help right away if you are not doing well or get worse. Document Released: 03/29/2004 Document Revised: 06/30/2013 Document Reviewed: 04/06/2013 [facility] Patient Information 2015 [place], [place]. This information is not intended to replace advice given to you by your health care provider. Make sure you discuss any questions you have with your health care provider.

## 2014-07-26 LAB — RPR: RPR Ser Ql: NONREACTIVE

## 2015-07-10 NOTE — L&D Delivery Note (Signed)
[facility] Delivery Note  At 12:56 PM a viable and healthy female (Remi)  was delivered via  (Presentation:OA).  APGAR:8 ,9 ; weight  .   Placenta status: intact.  Cord: 3 vessels.  Anesthesia:  None Episiotomy:  None Lacerations:  First degree Suture Repair: None Est. Blood Loss (mL):  250  Mom to postpartum.  Baby to Couplet care / Skin to Skin.  Nagee Goates V 02/19/2016, 1:09 PM

## 2015-07-10 NOTE — L&D Delivery Note (Signed)
Delivery Note for [place]  At 12:56 PM a viable and healthy female (Remi) was delivered via  (Presentation: OA).  APGAR:8 ,9 ; weight  .   Placenta status: intact.  Cord: 3 vessels.  Anesth: None   Episiotomy:  None Lacerations: First degree midline, no stitches required  Suture Repair: NA Est. Blood Loss (mL):  250  Mom to postpartum.  Baby to Couplet care / Skin to Skin.  Bardia Wangerin V 02/19/2016, 1:12 PM

## 2016-02-19 ENCOUNTER — Inpatient Hospital Stay (HOSPITAL_COMMUNITY)
Admission: AD | Admit: 2016-02-19 | Discharge: 2016-02-20 | DRG: 775 | Disposition: A | Discharge status: Home / Self Care | Payer: BC Managed Care – PPO | Source: Ambulatory Visit | Attending: Obstetrics and Gynecology | Admitting: Obstetrics and Gynecology

## 2016-02-19 ENCOUNTER — Encounter (HOSPITAL_COMMUNITY): Payer: Self-pay

## 2016-02-19 DIAGNOSIS — Z3483 Encounter for supervision of other normal pregnancy, third trimester: Secondary | ICD-10-CM | POA: Diagnosis present

## 2016-02-19 DIAGNOSIS — O99344 Other mental disorders complicating childbirth: Secondary | ICD-10-CM | POA: Diagnosis present

## 2016-02-19 DIAGNOSIS — F419 Anxiety disorder, unspecified: Secondary | ICD-10-CM | POA: Diagnosis present

## 2016-02-19 DIAGNOSIS — Z3A40 40 weeks gestation of pregnancy: Secondary | ICD-10-CM

## 2016-02-19 HISTORY — DX: Unspecified infectious disease: B99.9

## 2016-02-19 HISTORY — DX: Anxiety disorder, unspecified: F41.9

## 2016-02-19 LAB — TYPE AND SCREEN
ABO/RH(D): O POS
Antibody Screen: NEGATIVE

## 2016-02-19 LAB — CBC
HEMATOCRIT: 39.1 % (ref 36.0–46.0)
Hemoglobin: 13.2 g/dL (ref 12.0–15.0)
MCH: 32.8 pg (ref 26.0–34.0)
MCHC: 33.8 g/dL (ref 30.0–36.0)
MCV: 97.3 fL (ref 78.0–100.0)
PLATELETS: 198 10*3/uL (ref 150–400)
RBC: 4.02 MIL/uL (ref 3.87–5.11)
RDW: 14.6 % (ref 11.5–15.5)
WBC: 8.6 10*3/uL (ref 4.0–10.5)

## 2016-02-19 MED ORDER — LACTATED RINGERS IV SOLN: 500.0000 mL | INTRAVENOUS | Status: DC | PRN

## 2016-02-19 MED ORDER — WITCH HAZEL-GLYCERIN EX PADS: 1.0000 "application " | MEDICATED_PAD | CUTANEOUS | Status: DC | PRN

## 2016-02-19 MED ORDER — HYDROMORPHONE HCL 2 MG PO TABS: 2.0000 mg | ORAL_TABLET | Freq: Four times a day (QID) | ORAL | Status: DC | PRN

## 2016-02-19 MED ORDER — ONDANSETRON HCL 4 MG/2ML IJ SOLN
4.0000 mg | INTRAMUSCULAR | Status: DC | PRN
Start: 2016-02-19 — End: 2016-02-20

## 2016-02-19 MED ORDER — ACETAMINOPHEN 325 MG PO TABS: 650.0000 mg | ORAL_TABLET | ORAL | Status: DC | PRN

## 2016-02-19 MED ORDER — OXYCODONE-ACETAMINOPHEN 5-325 MG PO TABS: 2.0000 | ORAL_TABLET | ORAL | Status: DC | PRN

## 2016-02-19 MED ORDER — DIBUCAINE 1 % RE OINT: 1.0000 "application " | TOPICAL_OINTMENT | RECTAL | Status: DC | PRN

## 2016-02-19 MED ORDER — FLEET ENEMA 7-19 GM/118ML RE ENEM: 1.0000 | ENEMA | Freq: Once | RECTAL | Status: DC

## 2016-02-19 MED ORDER — OXYTOCIN BOLUS FROM INFUSION: 500.0000 mL | Freq: Once | INTRAVENOUS | Status: DC

## 2016-02-19 MED ORDER — TETANUS-DIPHTH-ACELL PERTUSSIS 5-2.5-18.5 LF-MCG/0.5 IM SUSP: 0.5000 mL | Freq: Once | INTRAMUSCULAR | Status: DC

## 2016-02-19 MED ORDER — SOD CITRATE-CITRIC ACID 500-334 MG/5ML PO SOLN: 30.0000 mL | ORAL | Status: DC | PRN

## 2016-02-19 MED ORDER — LIDOCAINE HCL (PF) 1 % IJ SOLN
30.0000 mL | INTRAMUSCULAR | Status: DC | PRN
  Filled 2016-02-19: qty 30

## 2016-02-19 MED ORDER — SIMETHICONE 80 MG PO CHEW: 80.0000 mg | CHEWABLE_TABLET | ORAL | Status: DC | PRN

## 2016-02-19 MED ORDER — COCONUT OIL OIL: 1.0000 "application " | TOPICAL_OIL | Status: DC | PRN

## 2016-02-19 MED ORDER — ZOLPIDEM TARTRATE 5 MG PO TABS: 5.0000 mg | ORAL_TABLET | Freq: Every evening | ORAL | Status: DC | PRN

## 2016-02-19 MED ORDER — SENNOSIDES-DOCUSATE SODIUM 8.6-50 MG PO TABS
2.0000 | ORAL_TABLET | ORAL | Status: DC
  Administered 2016-02-19: 2 via ORAL
  Filled 2016-02-19: qty 2

## 2016-02-19 MED ORDER — MEASLES, MUMPS & RUBELLA VAC ~~LOC~~ INJ
0.5000 mL | INJECTION | Freq: Once | SUBCUTANEOUS | Status: DC
  Filled 2016-02-19: qty 0.5

## 2016-02-19 MED ORDER — OXYTOCIN 10 UNIT/ML IJ SOLN
INTRAMUSCULAR | Status: AC
  Filled 2016-02-19: qty 1

## 2016-02-19 MED ORDER — MEDROXYPROGESTERONE ACETATE 150 MG/ML IM SUSP: 150.0000 mg | INTRAMUSCULAR | Status: DC | PRN

## 2016-02-19 MED ORDER — BENZOCAINE-MENTHOL 20-0.5 % EX AERO
1.0000 "application " | INHALATION_SPRAY | CUTANEOUS | Status: DC | PRN
  Filled 2016-02-19: qty 56

## 2016-02-19 MED ORDER — LACTATED RINGERS IV SOLN: INTRAVENOUS | Status: DC

## 2016-02-19 MED ORDER — IBUPROFEN 600 MG PO TABS
600.0000 mg | ORAL_TABLET | Freq: Four times a day (QID) | ORAL | Status: DC
  Administered 2016-02-19 – 2016-02-20 (×4): 600 mg via ORAL
  Filled 2016-02-19 (×4): qty 1

## 2016-02-19 MED ORDER — LIDOCAINE HCL (PF) 1 % IJ SOLN
INTRAMUSCULAR | Status: AC
  Filled 2016-02-19: qty 30

## 2016-02-19 MED ORDER — ONDANSETRON HCL 4 MG PO TABS: 4.0000 mg | ORAL_TABLET | ORAL | Status: DC | PRN

## 2016-02-19 MED ORDER — ONDANSETRON HCL 4 MG/2ML IJ SOLN: 4.0000 mg | Freq: Four times a day (QID) | INTRAMUSCULAR | Status: DC | PRN

## 2016-02-19 MED ORDER — PRENATAL MULTIVITAMIN CH
1.0000 | ORAL_TABLET | Freq: Every day | ORAL | Status: DC
  Administered 2016-02-20: 1 via ORAL
  Filled 2016-02-19: qty 1

## 2016-02-19 MED ORDER — DIPHENHYDRAMINE HCL 25 MG PO CAPS
25.0000 mg | ORAL_CAPSULE | Freq: Four times a day (QID) | ORAL | Status: DC | PRN
Start: 2016-02-19 — End: 2016-02-20

## 2016-02-19 MED ORDER — OXYTOCIN 40 UNITS IN LACTATED RINGERS INFUSION - SIMPLE MED: 2.5000 [IU]/h | INTRAVENOUS | Status: DC

## 2016-02-19 MED ORDER — OXYCODONE-ACETAMINOPHEN 5-325 MG PO TABS: 1.0000 | ORAL_TABLET | ORAL | Status: DC | PRN

## 2016-02-19 MED ORDER — OXYTOCIN 10 UNIT/ML IJ SOLN
10.0000 [IU] | Freq: Once | INTRAMUSCULAR | Status: AC
  Administered 2016-02-19: 10 [IU] via INTRAMUSCULAR

## 2016-02-19 NOTE — H&P (Signed)
Admission History and Physical Exam for an Obstetrics Patient  Ms. Carrie Coffey is a 30 y.o. female, [id number], at [redacted]w[redacted]d gestation, who presents for management of labor. She has been followed at the [facility] and Gynecology division of [facility].  Her pregnancy has been complicated by a Glucola screen of 160. Her 3 hour glucose tolerance test was normal however. The patient has a history of anxiety.. See history below.  OB History    Gravida Para Term Preterm AB Living   7 1 1   5 1    SAB TAB Ectopic Multiple Live Births   2 2 1  0 1      Past Medical History:  Diagnosis Date  . Anxiety   . BV (bacterial vaginosis)   . H/O joint problems   . Infection    UTI  . Psoriasis     Prescriptions Prior to Admission  Medication Sig Dispense Refill Last Dose  . loratadine (CLARITIN) 10 MG tablet Take 10 mg by mouth at bedtime.   02/18/2016 at Unknown time  . Prenatal Vit-Fe Fumarate-FA (MULTIVITAMIN-PRENATAL) 27-0.8 MG TABS tablet Take 1 tablet by mouth at bedtime.    02/18/2016 at Unknown time    Past Surgical History:  Procedure Laterality Date  . DILATION AND CURETTAGE OF UTERUS    . LAPAROSCOPY N/A 09/10/2013   Procedure: LAPAROSCOPY OPERATIVE;  Surgeon: Annalee Genta, DO;  Location: [facility];  Service: Gynecology;  Laterality: N/A;  . UNILATERAL SALPINGECTOMY Left 09/10/2013   Procedure: UNILATERAL SALPINGECTOMY;  Surgeon: Annalee Genta, DO;  Location: [facility];  Service: Gynecology;  Laterality: Left;    Allergies  Allergen Reactions  . Apple Hives and Swelling  . Food Hives, Swelling and Other (See Comments)    Pt states that she is allergic to strawberries.    . Pear Hives and Swelling  . Pseudoephedrine Hives    Family History: family history is not on file.  Social History:  reports that she has never smoked. She has never used smokeless tobacco. She reports that she does not drink alcohol or use drugs.  Review of systems: Normal  pregnancy complaints.  Admission Physical Exam:  Dilation: 7.5 Effacement (%): 100 Station: -1 Exam by:: A. Jones RNC Body mass index is 36.09 kg/m.  Blood pressure 141/69, temperature 98.7 F (37.1 C), temperature source Oral, resp. rate 20, height 5\' 1"  (1.549 m), weight 191 lb (86.6 kg), unknown if currently breastfeeding.  HEENT:                 Within normal limits Chest:                   Clear Heart:                    Regular rate and rhythm Abdomen:             Gravid and nontender Extremities:          Grossly normal Neurologic exam: Grossly normal Pelvic exam:         Cervix: 7 cm  Prenatal labs: ABO, Rh:              O+ HBsAg:                   nonreactive HIV:                         nonreactive GBS:  negative Antibody:               negative Rubella:                Immune RPR:                      nonreactive     Prenatal Transfer Tool  Maternal Diabetes: No Genetic Screening: Normal Maternal Ultrasounds/Referrals: Normal Fetal Ultrasounds or other Referrals:  None Maternal Substance Abuse:  No Significant Maternal Medications:  None Significant Maternal Lab Results:  None Other Comments:  None  Assessment:  [redacted]w[redacted]d gestation  Active labor at term  Plan:  Vaginal delivery   Marijose Curington V 02/19/2016, 12:48 PM

## 2016-02-19 NOTE — Progress Notes (Signed)
Dr Raphael Gibney notified orders received

## 2016-02-19 NOTE — MAU Note (Signed)
Pt states she does not want an IV, lab in to draw labs

## 2016-02-19 NOTE — MAU Note (Signed)
Labor started at 0700, started leaking at 1000, clear fluid. No bleeding.  Plans for water birth. No problems with preg

## 2016-02-20 LAB — CBC
HEMATOCRIT: 36.5 % (ref 36.0–46.0)
Hemoglobin: 12.3 g/dL (ref 12.0–15.0)
MCH: 32.5 pg (ref 26.0–34.0)
MCHC: 33.7 g/dL (ref 30.0–36.0)
MCV: 96.3 fL (ref 78.0–100.0)
PLATELETS: 192 10*3/uL (ref 150–400)
RBC: 3.79 MIL/uL — AB (ref 3.87–5.11)
RDW: 14.6 % (ref 11.5–15.5)
WBC: 14.2 10*3/uL — AB (ref 4.0–10.5)

## 2016-02-20 LAB — RPR: RPR Ser Ql: NONREACTIVE

## 2016-02-20 MED ORDER — OXYCODONE-ACETAMINOPHEN 5-325 MG PO TABS: 2.0000 | ORAL_TABLET | ORAL | 0 refills | Status: DC | PRN

## 2016-02-20 MED ORDER — IBUPROFEN 600 MG PO TABS: 600.0000 mg | ORAL_TABLET | Freq: Four times a day (QID) | ORAL | 0 refills | Status: DC

## 2016-02-20 NOTE — Lactation Note (Signed)
This note was copied from a baby's chart. Lactation Consultation Note: Mom has baby latched to breast when I went into room. Encouraged deeper latch- reviewed how to take baby off the breast. Mom relatched baby and reports she can feel the difference. Encouraged to use plenty of pillows for support. No questions at present. To call prn  Patient Name: Carrie Coffey [id number] Date: 02/20/2016 Reason for consult: Follow-up assessment   Maternal Data Formula Feeding for Exclusion: No Has patient been taught Hand Expression?: Yes Does the patient have breastfeeding experience prior to this delivery?: Yes  Feeding Feeding Type: Breast Fed  LATCH Score/Interventions Latch: Grasps breast easily, tongue down, lips flanged, rhythmical sucking.  Audible Swallowing: A few with stimulation  Type of Nipple: Everted at rest and after stimulation  Comfort (Breast/Nipple): Soft / non-tender     Hold (Positioning): Assistance needed to correctly position infant at breast and maintain latch. Intervention(s): Breastfeeding basics reviewed;Support Pillows;Position options  LATCH Score: 8  Lactation Tools Discussed/Used     Consult Status Consult Status: Complete    Truddie Crumble 02/20/2016, 10:37 AM

## 2016-02-20 NOTE — Discharge Summary (Signed)
Obstetric Discharge Summary Reason for Admission: onset of labor Prenatal Procedures: NST Intrapartum Procedures: spontaneous vaginal delivery Postpartum Procedures: none Complications-Operative and Postpartum: none Hemoglobin  Date Value Ref Range Status  02/20/2016 12.3 12.0 - 15.0 g/dL Final   HCT  Date Value Ref Range Status  02/20/2016 36.5 36.0 - 46.0 % Final    Physical Exam:  General: alert and cooperative Lochia: appropriate Uterine Fundus: firm Incision: na DVT Evaluation: No evidence of DVT seen on physical exam.  Discharge Diagnoses: Term Pregnancy-delivered  Discharge Information: Date: 02/20/2016 Activity: pelvic rest Diet: routine Medications: PNV, Ibuprofen and Percocet Condition: stable Instructions: refer to practice specific booklet Discharge to: home [facility]. Schedule an appointment as soon as possible for a visit in 6 week(s).   Specialty:  Obstetrics and Gynecology Contact information: [address] [place] [phone]          Newborn Data: Live born female  Birth Weight: 9 lb 13.1 oz (4455 g) APGAR: 8, 9  Home with mother.  [place] A 02/20/2016, 12:23 PM

## 2016-02-20 NOTE — Lactation Note (Signed)
This note was copied from a baby's chart. Lactation Consultation Note: Experienced BF mom reports baby has been latching well with no pain. NT doing hearing screen at present. No questions at present. Reviewed our phone number to call with questions.   Patient Name: Carrie Coffey [id number] Date: 02/20/2016 Reason for consult: Follow-up assessment   Maternal Data Formula Feeding for Exclusion: No Has patient been taught Hand Expression?: Yes Does the patient have breastfeeding experience prior to this delivery?: Yes  Feeding    LATCH Score/Interventions                      Lactation Tools Discussed/Used     Consult Status Consult Status: Complete    Truddie Crumble 02/20/2016, 9:57 AM

## 2016-02-20 NOTE — Lactation Note (Signed)
This note was copied from a baby's chart. Lactation Consultation Note Experienced BF mom of 1 yr to her now 32 month old. Mom stated she BF through her 1st trimester. Mom has pendulum breast w/large everted nipples. Baby latching well. Assisted in football position. Mom stated she couldn't remember exactly how. Baby latches w/o difficulty. Mom encouraged to feed baby 8-12 times/24 hours and with feeding cues. Referred to Baby and Me Book in Breastfeeding section Pg. 22-23 for position options and Proper latch demonstration. Educated about newborn behavior, I&O, STS, cluster feeding, supply and demand. [place] brochure given w/resources, support groups and [place] services. Patient Name: Boy Aaryahi Houp Today's Date: 02/20/2016 Reason for consult: Initial assessment   Maternal Data Has patient been taught Hand Expression?: Yes Does the patient have breastfeeding experience prior to this delivery?: Yes  Feeding Feeding Type: Breast Fed Length of feed: 20 min  LATCH Score/Interventions Latch: Repeated attempts needed to sustain latch, nipple held in mouth throughout feeding, stimulation needed to elicit sucking reflex. Intervention(s): Adjust position;Assist with latch;Breast massage;Breast compression  Audible Swallowing: A few with stimulation Intervention(s): Skin to skin;Hand expression;Alternate breast massage  Type of Nipple: Everted at rest and after stimulation  Comfort (Breast/Nipple): Soft / non-tender     Hold (Positioning): Assistance needed to correctly position infant at breast and maintain latch. Intervention(s): Breastfeeding basics reviewed;Support Pillows;Position options;Skin to skin  LATCH Score: 7  Lactation Tools Discussed/Used     Consult Status Consult Status: Follow-up Date: 02/21/16 Follow-up type: In-patient    Rubee Vega, Elta Gu[place] 02/20/2016, 3:25 AM

## 2016-02-20 NOTE — Discharge Instructions (Signed)

## 2016-02-23 ENCOUNTER — Inpatient Hospital Stay (HOSPITAL_COMMUNITY): Admission: RE | Admit: 2016-02-23 | Payer: BC Managed Care – PPO | Source: Ambulatory Visit

## 2018-07-09 NOTE — L&D Delivery Note (Signed)
Delivery Note   Patient Name: Carrie Coffey DOB: [birth year] MRN: [id number]  Date of admission: 01/31/2019 Delivering MD: Noralyn Pick  Date of delivery: 01/31/19 Type of delivery: SVD  Newborn Data: Live born female  Birth Weight:   APGAR: 10, 9  Newborn Delivery   Birth date/time: 01/31/2019 10:28:00 Delivery type: Vaginal, Spontaneous     [place], 33 y.o., @ [redacted]w[redacted]d,  [phone], who was admitted for spontaneous labor. I was called to the room when she progressed +2 station in the second stage of labor.  She pushed for 5/min on hands and knees.  She delivered a viable infant, cephalic and restituted to the LOA position over an intact perineum.  A nuchal cord   was not identified. The baby was thread under the moms legs and mom turn on her back and baby was placed on her abdomen or [facility]. The baby was placed on maternal abdomen while initial step of NRP were perfmored (Dry, Stimulated, and warmed). Hat placed on baby for thermoregulation. Delayed cord clamping was performed for 3 minutes.  Cord double clamped and cut.  Cord cut by father. Apgar scores were 8 and 9. Prophylactic Pitocin was not given due to firm uterus, no IV access.The placenta delivered spontaneously, shultz, with a 3 vessel cord and was sent to LD.  Inspection revealed none. An examination of the vaginal vault and cervix was free from lacerations. The uterus was firm, bleeding stable.  Placenta and umbilical artery blood gas were not sent.  There were no complications during the procedure.  Mom and baby skin to skin following delivery. Left in stable condition.  Maternal Info: Anesthesia:Epidural Episiotomy: no Lacerations:  no Suture Repair: no Est. Blood Loss (mL):  200  Newborn Info:  Baby Sex: female Circumcision: in pt desired, DR Landry Mellow aware.  Babies Name: ??? APGAR (1 MIN): 8   APGAR (5 MINS): 9   APGAR (10 MINS):     Mom to postpartum.  Baby to Couplet care / Skin to Skin.  Dr Landry Mellow updated on birth     [place], [place], NP-C 01/31/19 10:48 AM

## 2018-08-22 ENCOUNTER — Other Ambulatory Visit (HOSPITAL_COMMUNITY): Payer: Self-pay | Admitting: Obstetrics & Gynecology

## 2018-08-22 DIAGNOSIS — Z3A20 20 weeks gestation of pregnancy: Secondary | ICD-10-CM

## 2018-08-22 DIAGNOSIS — Z3689 Encounter for other specified antenatal screening: Secondary | ICD-10-CM

## 2018-08-30 ENCOUNTER — Inpatient Hospital Stay (HOSPITAL_BASED_OUTPATIENT_CLINIC_OR_DEPARTMENT_OTHER): Payer: BC Managed Care – PPO

## 2018-08-30 ENCOUNTER — Encounter (HOSPITAL_COMMUNITY): Payer: Self-pay

## 2018-08-30 ENCOUNTER — Inpatient Hospital Stay (HOSPITAL_COMMUNITY)
Admission: AD | Admit: 2018-08-30 | Discharge: 2018-08-30 | Disposition: A | Discharge status: Home / Self Care | Payer: BC Managed Care – PPO | Source: Ambulatory Visit | Attending: Obstetrics and Gynecology | Admitting: Obstetrics and Gynecology

## 2018-08-30 DIAGNOSIS — O4692 Antepartum hemorrhage, unspecified, second trimester: Secondary | ICD-10-CM

## 2018-08-30 DIAGNOSIS — Z3A17 17 weeks gestation of pregnancy: Secondary | ICD-10-CM | POA: Diagnosis not present

## 2018-08-30 DIAGNOSIS — O209 Hemorrhage in early pregnancy, unspecified: Secondary | ICD-10-CM | POA: Insufficient documentation

## 2018-08-30 DIAGNOSIS — N939 Abnormal uterine and vaginal bleeding, unspecified: Secondary | ICD-10-CM

## 2018-08-30 LAB — URINALYSIS, MICROSCOPIC (REFLEX): WBC, UA: NONE SEEN WBC/hpf (ref 0–5)

## 2018-08-30 LAB — URINALYSIS, ROUTINE W REFLEX MICROSCOPIC
BILIRUBIN URINE: NEGATIVE
Glucose, UA: NEGATIVE mg/dL
KETONES UR: NEGATIVE mg/dL
Leukocytes,Ua: NEGATIVE
Nitrite: NEGATIVE
PROTEIN: NEGATIVE mg/dL
Specific Gravity, Urine: 1.01 (ref 1.005–1.030)
pH: 7 (ref 5.0–8.0)

## 2018-08-30 NOTE — MAU Note (Signed)
Carrie Coffey is a 33 y.o. at [redacted]w[redacted]d here in MAU reporting: noticed bright bleeding on toilet paper this afternoon. States she had to wipe several times to clean the blood. No recent IC or falls. Had some mild cramping but it has since subsided.   Onset of complaint: this afternoon  Pain score: 0/10  Vitals:   08/30/18 1722  BP: 106/62  Pulse: 79  Resp: 18  Temp: 98.1 F (36.7 C)  SpO2: 98%      Lab orders placed from triage: none, will receive orders when RN calls [place] CNM. Pt able to give urine sample.

## 2018-08-30 NOTE — MAU Provider Note (Signed)
Chief Complaint: Vaginal Bleeding   None    SUBJECTIVE HPI: Carrie Coffey is a 33 y.o. [phone] at [redacted]w[redacted]d who presents to Maternity Admissions reporting vaginal bleeding this afternoon  Location: vaginal Quality: small amount bright red Duration: one time Context:some in toliet Timing: denies intercourse, happened when up to BR  Associated signs and symptoms: denies cramping  Past Medical History:  Diagnosis Date  . Anxiety   . BV (bacterial vaginosis)   . H/O joint problems   . Infection    UTI  . Psoriasis    OB History  Gravida Para Term Preterm AB Living  8 2 2   5 2   SAB TAB Ectopic Multiple Live Births  2 2 1  0 2    # Outcome Date GA Lbr Len/2nd Weight Sex Delivery Anes PTL Lv  8 Current           7 Term 02/19/16 [redacted]w[redacted]d 05:23 / 00:03 4455 g M Vag-Spont None  LIV  6 Term 07/23/14 [redacted]w[redacted]d 16:30 / 00:10 3572 g M Vag-Spont None  LIV  5 Ectopic           4 SAB           3 TAB           2 TAB           1 SAB            Past Surgical History:  Procedure Laterality Date  . DILATION AND CURETTAGE OF UTERUS    . LAPAROSCOPY N/A 09/10/2013   Procedure: LAPAROSCOPY OPERATIVE;  Surgeon: Annalee Genta, DO;  Location: [facility];  Service: Gynecology;  Laterality: N/A;  . UNILATERAL SALPINGECTOMY Left 09/10/2013   Procedure: UNILATERAL SALPINGECTOMY;  Surgeon: Annalee Genta, DO;  Location: [facility];  Service: Gynecology;  Laterality: Left;   Social History   Socioeconomic History  . Marital status: Married    Spouse name: Not on file  . Number of children: Not on file  . Years of education: Not on file  . Highest education level: Not on file  Occupational History  . Not on file  Social Needs  . Financial resource strain: Not on file  . Food insecurity:    Worry: Not on file    Inability: Not on file  . Transportation needs:    Medical: Not on file    Non-medical: Not on file  Tobacco Use  . Smoking status: Never Smoker  . Smokeless tobacco: Never Used  Substance and  Sexual Activity  . Alcohol use: No    Alcohol/week: 4.0 - 5.0 standard drinks    Types: 4 - 5 Glasses of wine per week  . Drug use: No  . Sexual activity: Yes    Partners: Male    Birth control/protection: None    Comment: Condoms  Lifestyle  . Physical activity:    Days per week: Not on file    Minutes per session: Not on file  . Stress: Not on file  Relationships  . Social connections:    Talks on phone: Not on file    Gets together: Not on file    Attends religious service: Not on file    Active member of club or organization: Not on file    Attends meetings of clubs or organizations: Not on file    Relationship status: Not on file  . Intimate partner violence:    Fear of current or ex partner: Not on file  Emotionally abused: Not on file    Physically abused: Not on file    Forced sexual activity: Not on file  Other Topics Concern  . Not on file  Social History Narrative  . Not on file   Family History  Problem Relation Age of Onset  . Asthma Neg Hx   . Cancer Neg Hx   . Diabetes Neg Hx   . Hearing loss Neg Hx   . Heart disease Neg Hx   . Hypertension Neg Hx   . Stroke Neg Hx    No current facility-administered medications on file prior to encounter.    Current Outpatient Medications on File Prior to Encounter  Medication Sig Dispense Refill  . ibuprofen (ADVIL,MOTRIN) 600 MG tablet Take 1 tablet (600 mg total) by mouth every 6 (six) hours. 30 tablet 0  . oxyCODONE-acetaminophen (PERCOCET/ROXICET) 5-325 MG tablet Take 2 tablets by mouth every 4 (four) hours as needed (for pain scale equal to or greater than 7.). 30 tablet 0   Allergies  Allergen Reactions  . Apple Hives and Swelling  . Food Hives, Swelling and Other (See Comments)    Pt states that she is allergic to strawberries.    . Pear Hives and Swelling  . Pseudoephedrine Hives    I have reviewed patient's Past Medical Hx, Surgical Hx, Family Hx, Social Hx, medications and allergies.   Review of  Systems  Constitutional: Negative.   HENT: Negative.   Eyes: Negative.   Respiratory: Negative.   Cardiovascular: Negative.   Gastrointestinal: Negative.   Endocrine: Negative.   Genitourinary: Positive for vaginal bleeding.  Musculoskeletal: Negative.   Skin: Negative.   Allergic/Immunologic: Negative.   Neurological: Negative.   Hematological: Negative.   Psychiatric/Behavioral: Negative.     OBJECTIVE Patient Vitals for the past 24 hrs:  BP Temp Temp src Pulse Resp SpO2 Height Weight  08/30/18 1722 106/62 98.1 F (36.7 C) Oral 79 18 98 % - -  08/30/18 1718 - - - - - - 5\' 1"  (1.549 m) 72.7 kg   Constitutional: Well-developed, well-nourished female in no acute distress.  Cardiovascular: normal rate Respiratory: normal rate and effort.  GI: Abd soft, non-tender, gravid appropriate for gestational age. Pos BS x 4 MS: Extremities nontender, no edema, normal ROM Neurologic: Alert and oriented x 4.  GU: Neg CVAT.  SPECULUM EXAM: NEFG, physiologic discharge, small amount blood noted, cervix clean  BIMANUAL: cervix closed uterus normal size, no adnexal tenderness or masses.  No CMT.  LAB RESULTS Results for orders placed or performed during the hospital encounter of 08/30/18 (from the past 24 hour(s))  Urinalysis, Routine w reflex microscopic     Status: Abnormal   Collection Time: 08/30/18  6:21 PM  Result Value Ref Range   Color, Urine YELLOW YELLOW   APPearance CLEAR CLEAR   Specific Gravity, Urine 1.010 1.005 - 1.030   pH 7.0 5.0 - 8.0   Glucose, UA NEGATIVE NEGATIVE mg/dL   Hgb urine dipstick TRACE (A) NEGATIVE   Bilirubin Urine NEGATIVE NEGATIVE   Ketones, ur NEGATIVE NEGATIVE mg/dL   Protein, ur NEGATIVE NEGATIVE mg/dL   Nitrite NEGATIVE NEGATIVE   Leukocytes,Ua NEGATIVE NEGATIVE  Urinalysis, Microscopic (reflex)     Status: Abnormal   Collection Time: 08/30/18  6:21 PM  Result Value Ref Range   RBC / HPF 0-5 0 - 5 RBC/hpf   WBC, UA NONE SEEN 0 - 5 WBC/hpf    Bacteria, UA RARE (A) NONE SEEN   Squamous  Epithelial / LPF 0-5 0 - 5    IMAGING No results found.  MAU COURSE Orders Placed This Encounter  Procedures  . [place] MFM OB LIMITED  . Urinalysis, Routine w reflex microscopic  . Urinalysis, Microscopic (reflex)  . Discharge patient Discharge disposition: 01-Home or Self Care; Discharge patient date: 08/30/2018   No orders of the defined types were placed in this encounter.   MDM Labs and [place] reviewed.  Pt stable will discharge home  ASSESSMENT 1. Vaginal bleeding in pregnancy, second trimester   2. Vaginal bleeding     PLAN Discharge home in stable condition. Increased bleeding and cramping precautions.  Follow up at regular visit.  Allergies as of 08/30/2018      Reactions   Apple Hives, Swelling   Food Hives, Swelling, Other (See Comments)   Pt states that she is allergic to strawberries.     Pear Hives, Swelling   Pseudoephedrine Hives      Medication List    STOP taking these medications   ibuprofen 600 MG tablet Commonly known as:  ADVIL,MOTRIN   oxyCODONE-acetaminophen 5-325 MG tablet Commonly known as:  PERCOCET/ROXICET        Starla Link, CNM 08/30/2018  9:16 PM

## 2018-08-30 NOTE — MAU Provider Note (Signed)
Chief Complaint: Vaginal Bleeding   SUBJECTIVE HPI: Carrie Coffey is a 33 y.o. [phone] at [redacted]w[redacted]d who presents to Maternity Admissions reporting new onset vaginal bleeding at 1600 this afternoon when wiping after urination.   Location: vaginal Quality: bright red Severity: small amount Duration: one time Context: also dripping into commode, had to wipe a few times but did not saturate tissue Timing: 1600   Past Medical History:  Diagnosis Date  . Anxiety   . BV (bacterial vaginosis)   . H/O joint problems   . Infection    UTI  . Psoriasis    OB History  Gravida Para Term Preterm AB Living  8 2 2   5 2   SAB TAB Ectopic Multiple Live Births  2 2 1  0 2    # Outcome Date GA Lbr Len/2nd Weight Sex Delivery Anes PTL Lv  8 Current           7 Term 02/19/16 [redacted]w[redacted]d 05:23 / 00:03 4455 g M Vag-Spont None  LIV  6 Term 07/23/14 [redacted]w[redacted]d 16:30 / 00:10 3572 g M Vag-Spont None  LIV  5 Ectopic           4 SAB           3 TAB           2 TAB           1 SAB            Past Surgical History:  Procedure Laterality Date  . DILATION AND CURETTAGE OF UTERUS    . LAPAROSCOPY N/A 09/10/2013   Procedure: LAPAROSCOPY OPERATIVE;  Surgeon: Annalee Genta, DO;  Location: [facility];  Service: Gynecology;  Laterality: N/A;  . UNILATERAL SALPINGECTOMY Left 09/10/2013   Procedure: UNILATERAL SALPINGECTOMY;  Surgeon: Annalee Genta, DO;  Location: [facility];  Service: Gynecology;  Laterality: Left;   Social History   Socioeconomic History  . Marital status: Married    Spouse name: Not on file  . Number of children: Not on file  . Years of education: Not on file  . Highest education level: Not on file  Occupational History  . Not on file  Social Needs  . Financial resource strain: Not on file  . Food insecurity:    Worry: Not on file    Inability: Not on file  . Transportation needs:    Medical: Not on file    Non-medical: Not on file  Tobacco Use  . Smoking status: Never Smoker  . Smokeless  tobacco: Never Used  Substance and Sexual Activity  . Alcohol use: No    Alcohol/week: 4.0 - 5.0 standard drinks    Types: 4 - 5 Glasses of wine per week  . Drug use: No  . Sexual activity: Yes    Partners: Male    Birth control/protection: None    Comment: Condoms  Lifestyle  . Physical activity:    Days per week: Not on file    Minutes per session: Not on file  . Stress: Not on file  Relationships  . Social connections:    Talks on phone: Not on file    Gets together: Not on file    Attends religious service: Not on file    Active member of club or organization: Not on file    Attends meetings of clubs or organizations: Not on file    Relationship status: Not on file  . Intimate partner violence:    Fear of current or  ex partner: Not on file    Emotionally abused: Not on file    Physically abused: Not on file    Forced sexual activity: Not on file  Other Topics Concern  . Not on file  Social History Narrative  . Not on file   Family History  Problem Relation Age of Onset  . Asthma Neg Hx   . Cancer Neg Hx   . Diabetes Neg Hx   . Hearing loss Neg Hx   . Heart disease Neg Hx   . Hypertension Neg Hx   . Stroke Neg Hx    No current facility-administered medications on file prior to encounter.    Current Outpatient Medications on File Prior to Encounter  Medication Sig Dispense Refill  . ibuprofen (ADVIL,MOTRIN) 600 MG tablet Take 1 tablet (600 mg total) by mouth every 6 (six) hours. 30 tablet 0  . oxyCODONE-acetaminophen (PERCOCET/ROXICET) 5-325 MG tablet Take 2 tablets by mouth every 4 (four) hours as needed (for pain scale equal to or greater than 7.). 30 tablet 0   Allergies  Allergen Reactions  . Apple Hives and Swelling  . Food Hives, Swelling and Other (See Comments)    Pt states that she is allergic to strawberries.    . Pear Hives and Swelling  . Pseudoephedrine Hives    I have reviewed patient's Past Medical Hx, Surgical Hx, Family Hx, Social Hx,  medications and allergies.   Review of Systems  Denies abdominal pain, cramping, urinary urgency/frequency/burning, vaginal discharge/odor/itching. Denies recent sexual intercourse or trauma. All ten systems reviewed and negative, except as noted.  OBJECTIVE Patient Vitals for the past 24 hrs:  BP Temp Temp src Pulse Resp SpO2 Height Weight  08/30/18 1722 106/62 98.1 F (36.7 C) Oral 79 18 98 % - -  08/30/18 1718 - - - - - - 5\' 1"  (1.549 m) 72.7 kg   Constitutional: Well-developed, well-nourished female in no acute distress.  Cardiovascular: normal rate Respiratory: normal rate and effort.  GI: Abd soft, non-tender except slight tenderness in RLQ, gravid appropriate for gestational age.  MS: Extremities nontender, no edema, normal ROM Neurologic: Alert and oriented x 4.  GU: Neg CVAT.  SPECULUM EXAM: NEFG, physiologic discharge, no blood noted, cervix clean, eternal os slightly open, swab from center of external os produced scant amount of red blood. No polyps or lesions noted.   SVE: closed and thick  LAB RESULTS No results found for this or any previous visit (from the past 24 hour(s)).  IMAGING No results found.  MAU COURSE Orders Placed This Encounter  Procedures  . [place] MFM OB COMP + 14 WK  . Urinalysis, Routine w reflex microscopic   No orders of the defined types were placed in this encounter.   MDM New onset VB Most likely due to: Known Subchorionic hemorrhage measuring 1.1 x 4 x 6 cm on 12wk [place] (07/24/18) and/or left subserosal fibroid on same scan measuring 1.78 x 1.2 x 1.22 cm.  Rule out vaginal/cervical causes, UTI, PTL. Assess fetal well being.  Cervix is closed, not contracting. FHT reassuring. No vaginal or urinary sx present.  Pending: GC/CT, UA, [place].  ASSESSMENT 1. Vaginal bleeding     TRANSFER CARE TO N.[facility]HERO AT SHIFT CHANGE, PENDING US/DISCHARGE.     Merlene Laughter 08/30/2018  6:44 PM

## 2018-09-01 LAB — GC/CHLAMYDIA PROBE AMP (~~LOC~~) NOT AT ARMC
CHLAMYDIA, DNA PROBE: NEGATIVE
NEISSERIA GONORRHEA: NEGATIVE

## 2018-09-16 ENCOUNTER — Ambulatory Visit (HOSPITAL_COMMUNITY)
Admission: RE | Admit: 2018-09-16 | Discharge: 2018-09-16 | Disposition: A | Discharge status: Home / Self Care | Payer: BC Managed Care – PPO | Source: Ambulatory Visit | Attending: Obstetrics & Gynecology | Admitting: Obstetrics & Gynecology

## 2018-09-16 DIAGNOSIS — Z3A20 20 weeks gestation of pregnancy: Secondary | ICD-10-CM | POA: Insufficient documentation

## 2018-09-16 DIAGNOSIS — Z3689 Encounter for other specified antenatal screening: Secondary | ICD-10-CM | POA: Insufficient documentation

## 2018-09-16 DIAGNOSIS — Z363 Encounter for antenatal screening for malformations: Secondary | ICD-10-CM

## 2018-09-17 ENCOUNTER — Other Ambulatory Visit (HOSPITAL_COMMUNITY): Payer: Self-pay | Admitting: *Deleted

## 2018-09-17 DIAGNOSIS — Z362 Encounter for other antenatal screening follow-up: Secondary | ICD-10-CM

## 2018-10-14 ENCOUNTER — Ambulatory Visit (HOSPITAL_COMMUNITY): Payer: BC Managed Care – PPO

## 2018-11-18 ENCOUNTER — Encounter (HOSPITAL_COMMUNITY): Payer: Self-pay

## 2018-11-18 ENCOUNTER — Ambulatory Visit (HOSPITAL_COMMUNITY): Payer: BC Managed Care – PPO

## 2019-01-31 ENCOUNTER — Inpatient Hospital Stay (HOSPITAL_COMMUNITY)
Admission: AD | Admit: 2019-01-31 | Discharge: 2019-02-01 | DRG: 807 | Disposition: A | Discharge status: Home / Self Care | Payer: BC Managed Care – PPO | Attending: Obstetrics and Gynecology | Admitting: Obstetrics and Gynecology

## 2019-01-31 ENCOUNTER — Other Ambulatory Visit: Payer: Self-pay

## 2019-01-31 ENCOUNTER — Encounter (HOSPITAL_COMMUNITY): Payer: Self-pay

## 2019-01-31 DIAGNOSIS — Z3A39 39 weeks gestation of pregnancy: Secondary | ICD-10-CM | POA: Diagnosis not present

## 2019-01-31 DIAGNOSIS — D252 Subserosal leiomyoma of uterus: Secondary | ICD-10-CM | POA: Diagnosis present

## 2019-01-31 DIAGNOSIS — O26893 Other specified pregnancy related conditions, third trimester: Secondary | ICD-10-CM | POA: Diagnosis present

## 2019-01-31 DIAGNOSIS — Z1159 Encounter for screening for other viral diseases: Secondary | ICD-10-CM

## 2019-01-31 DIAGNOSIS — O3413 Maternal care for benign tumor of corpus uteri, third trimester: Principal | ICD-10-CM | POA: Diagnosis present

## 2019-01-31 LAB — CBC
HCT: 43.1 % (ref 36.0–46.0)
Hemoglobin: 14.2 g/dL (ref 12.0–15.0)
MCH: 33.1 pg (ref 26.0–34.0)
MCHC: 32.9 g/dL (ref 30.0–36.0)
MCV: 100.5 fL — ABNORMAL HIGH (ref 80.0–100.0)
Platelets: 175 10*3/uL (ref 150–400)
RBC: 4.29 MIL/uL (ref 3.87–5.11)
RDW: 14.6 % (ref 11.5–15.5)
WBC: 13.3 10*3/uL — ABNORMAL HIGH (ref 4.0–10.5)
nRBC: 0 % (ref 0.0–0.2)

## 2019-01-31 LAB — TYPE AND SCREEN
ABO/RH(D): O POS
Antibody Screen: NEGATIVE

## 2019-01-31 LAB — ABO/RH: ABO/RH(D): O POS

## 2019-01-31 LAB — SARS CORONAVIRUS 2 BY RT PCR (HOSPITAL ORDER, PERFORMED IN ~~LOC~~ HOSPITAL LAB): SARS Coronavirus 2: NEGATIVE

## 2019-01-31 MED ORDER — OXYTOCIN 40 UNITS IN NORMAL SALINE INFUSION - SIMPLE MED: 2.5000 [IU]/h | INTRAVENOUS | Status: DC

## 2019-01-31 MED ORDER — ZOLPIDEM TARTRATE 5 MG PO TABS: 5.0000 mg | ORAL_TABLET | Freq: Every evening | ORAL | Status: DC | PRN

## 2019-01-31 MED ORDER — ONDANSETRON HCL 4 MG/2ML IJ SOLN: 4.0000 mg | INTRAMUSCULAR | Status: DC | PRN

## 2019-01-31 MED ORDER — OXYCODONE-ACETAMINOPHEN 5-325 MG PO TABS: 1.0000 | ORAL_TABLET | ORAL | Status: DC | PRN

## 2019-01-31 MED ORDER — COCONUT OIL OIL: 1.0000 "application " | TOPICAL_OIL | Status: DC | PRN

## 2019-01-31 MED ORDER — SOD CITRATE-CITRIC ACID 500-334 MG/5ML PO SOLN: 30.0000 mL | ORAL | Status: DC | PRN

## 2019-01-31 MED ORDER — SIMETHICONE 80 MG PO CHEW
80.0000 mg | CHEWABLE_TABLET | ORAL | Status: DC | PRN
  Administered 2019-01-31: 80 mg via ORAL
  Filled 2019-01-31: qty 1

## 2019-01-31 MED ORDER — SENNOSIDES-DOCUSATE SODIUM 8.6-50 MG PO TABS
2.0000 | ORAL_TABLET | ORAL | Status: DC
  Administered 2019-02-01: 2 via ORAL
  Filled 2019-01-31: qty 2

## 2019-01-31 MED ORDER — OXYTOCIN BOLUS FROM INFUSION: 500.0000 mL | Freq: Once | INTRAVENOUS | Status: DC

## 2019-01-31 MED ORDER — OXYTOCIN 10 UNIT/ML IJ SOLN
10.0000 [IU] | Freq: Once | INTRAMUSCULAR | Status: DC
  Filled 2019-01-31: qty 1

## 2019-01-31 MED ORDER — ONDANSETRON HCL 4 MG/2ML IJ SOLN: 4.0000 mg | Freq: Four times a day (QID) | INTRAMUSCULAR | Status: DC | PRN

## 2019-01-31 MED ORDER — PRENATAL MULTIVITAMIN CH
1.0000 | ORAL_TABLET | Freq: Every day | ORAL | Status: DC
  Administered 2019-01-31 – 2019-02-01 (×2): 1 via ORAL
  Filled 2019-01-31 (×2): qty 1

## 2019-01-31 MED ORDER — ONDANSETRON HCL 4 MG PO TABS: 4.0000 mg | ORAL_TABLET | ORAL | Status: DC | PRN

## 2019-01-31 MED ORDER — ACETAMINOPHEN 325 MG PO TABS: 650.0000 mg | ORAL_TABLET | ORAL | Status: DC | PRN

## 2019-01-31 MED ORDER — TETANUS-DIPHTH-ACELL PERTUSSIS 5-2.5-18.5 LF-MCG/0.5 IM SUSP: 0.5000 mL | Freq: Once | INTRAMUSCULAR | Status: DC

## 2019-01-31 MED ORDER — DIPHENHYDRAMINE HCL 25 MG PO CAPS: 25.0000 mg | ORAL_CAPSULE | Freq: Four times a day (QID) | ORAL | Status: DC | PRN

## 2019-01-31 MED ORDER — DIBUCAINE (PERIANAL) 1 % EX OINT: 1.0000 "application " | TOPICAL_OINTMENT | CUTANEOUS | Status: DC | PRN

## 2019-01-31 MED ORDER — LIDOCAINE HCL (PF) 1 % IJ SOLN: 30.0000 mL | INTRAMUSCULAR | Status: DC | PRN

## 2019-01-31 MED ORDER — FLEET ENEMA 7-19 GM/118ML RE ENEM: 1.0000 | ENEMA | RECTAL | Status: DC | PRN

## 2019-01-31 MED ORDER — IBUPROFEN 600 MG PO TABS
600.0000 mg | ORAL_TABLET | Freq: Four times a day (QID) | ORAL | Status: DC
  Administered 2019-01-31 – 2019-02-01 (×5): 600 mg via ORAL
  Filled 2019-01-31 (×5): qty 1

## 2019-01-31 MED ORDER — LACTATED RINGERS IV SOLN: INTRAVENOUS | Status: DC

## 2019-01-31 MED ORDER — LACTATED RINGERS IV SOLN: 500.0000 mL | INTRAVENOUS | Status: DC | PRN

## 2019-01-31 MED ORDER — WITCH HAZEL-GLYCERIN EX PADS: 1.0000 "application " | MEDICATED_PAD | CUTANEOUS | Status: DC | PRN

## 2019-01-31 MED ORDER — OXYCODONE-ACETAMINOPHEN 5-325 MG PO TABS: 2.0000 | ORAL_TABLET | ORAL | Status: DC | PRN

## 2019-01-31 MED ORDER — BENZOCAINE-MENTHOL 20-0.5 % EX AERO
1.0000 "application " | INHALATION_SPRAY | CUTANEOUS | Status: DC | PRN
  Administered 2019-01-31: 1 via TOPICAL
  Filled 2019-01-31: qty 56

## 2019-01-31 NOTE — MAU Note (Signed)
Pt reports ctx since 0500 this morning. Pt denies LOF, reports good fetal movement. Pt also reports pelvic pressure.

## 2019-01-31 NOTE — H&P (Signed)
Carrie Coffey is a 33 y.o. female, [phone], IUP at 39.5 weeks, presenting for spontaneous labor and desired for natural delivery. PNH consist with prior LGA babies, anxiety no meds, subserosal maternal left fibroid, and +flu during pregnancy tx with tamiflu in [place]. Pt endorse + Fm. Denies vaginal leakage. Denies vaginal bleeding. Denies feeling cxt's.   Patient Active Problem List   Diagnosis Date Noted  . Normal labor 01/31/2019  . Labor and delivery indication for care or intervention 02/19/2016  . First degree perineal laceration during delivery 07/25/2014  . Vaginal delivery 07/23/2014  . Skin tag of vulva 07/23/2014  . Ectopic pregnancy 08/01/2013   No medications prior to admission.    Past Medical History:  Diagnosis Date  . Anxiety   . BV (bacterial vaginosis)   . H/O joint problems   . Infection    UTI  . Psoriasis      No current facility-administered medications on file prior to encounter.    No current outpatient medications on file prior to encounter.     Allergies  Allergen Reactions  . Apple Hives and Swelling  . Food Hives, Swelling and Other (See Comments)    Pt states that she is allergic to strawberries.    . Pear Hives and Swelling  . Pseudoephedrine Hives    History of present pregnancy: Pt Info/Preference:  Screening/Consents:  Labs:   EDD: Estimated Date of Delivery: 02/02/19  Establised: No LMP recorded. Patient is pregnant.  Anatomy Scan: Date: 09/17/2018 Placenta Location: anterior Genetic Screen: Panoroma:declined AFP:  First Tri: WNL Quad:  Office: [facility]             First PNV: 12.3 wg Blood Type  O+  Language: englsih Last PNV: 39.4 wg Rhogam    Flu Vaccine:  declined   Antibody  Neg  TDaP vaccine utd   GTT: Early: 5.5 Third Trimester: 3H GTT WNL  Feeding Plan: breast BTL: no Rubella:  Immune  Contraception: ??? VBAC: no RPR:   NR  Circumcision: In pt desired   HBsAg:  neg  Pediatrician:  Dr Melonie [place] peds   HIV:   neg   Prenatal Classes: no Additional [place]: 6/29 see belwo GBS:  Negative(For PCN allergy, check sensitivities)       Chlamydia: neg    MFM Referral/Consult:  GC: neg  Support Person: husband   PAP: 2018-normal  Pain Management: Natural Neonatologist Referral:  Hgb Electrophoresis:  AA  Birth Plan: Natural birth with [facility]    Hgb NOB: 11.4    28W: 11.9  Growth scan 01/05/2019:   OB History    Gravida  8   Para  2   Term  2   Preterm      AB  5   Living  2     SAB  2   TAB  2   Ectopic  1   Multiple  0   Live Births  2          Past Medical History:  Diagnosis Date  . Anxiety   . BV (bacterial vaginosis)   . H/O joint problems   . Infection    UTI  . Psoriasis    Past Surgical History:  Procedure Laterality Date  . DILATION AND CURETTAGE OF UTERUS    . LAPAROSCOPY N/A 09/10/2013   Procedure: LAPAROSCOPY OPERATIVE;  Surgeon: Annalee Genta, DO;  Location: [facility];  Service: Gynecology;  Laterality: N/A;  . UNILATERAL SALPINGECTOMY Left 09/10/2013   Procedure:  UNILATERAL SALPINGECTOMY;  Surgeon: Annalee Genta, DO;  Location: [facility];  Service: Gynecology;  Laterality: Left;   Family History: family history is not on file. Social History:  reports that she has never smoked. She has never used smokeless tobacco. She reports that she does not drink alcohol or use drugs.   Prenatal Transfer Tool  Maternal Diabetes: No Genetic Screening: Normal Maternal Ultrasounds/Referrals: Normal Fetal Ultrasounds or other Referrals:  None Maternal Substance Abuse:  No Significant Maternal Medications:  None Significant Maternal Lab Results: Group B Strep negative  ROS:  Review of Systems  Constitutional: Negative.   HENT: Negative.   Eyes: Negative.   Respiratory: Negative.   Cardiovascular: Negative.   Gastrointestinal: Positive for abdominal pain.  Genitourinary: Negative.   Musculoskeletal: Negative.   Skin: Negative.   Neurological: Negative.   Endo/Heme/Allergies:  Negative.   Psychiatric/Behavioral: Negative.      Physical Exam: BP 136/71   Pulse (!) 104   Temp 97.9 F (36.6 C) (Oral)   Resp 18   Ht 5\' 1"  (1.549 m)   Wt 87.1 kg   SpO2 100%   BMI 36.28 kg/m   Physical Exam  Constitutional: She is oriented to person, place, and time and well-developed, well-nourished, and in no distress.  HENT:  Head: Normocephalic and atraumatic.  Eyes: Pupils are equal, round, and reactive to light. Conjunctivae are normal.  Neck: Normal range of motion. Neck supple.  Cardiovascular: Normal rate and regular rhythm.  Pulmonary/Chest: Effort normal and breath sounds normal.  Abdominal: Soft. Bowel sounds are normal.  Genitourinary:    Genitourinary Comments: Uterus gravida equal to dates, pelvis adequate for vaginal delivery, uterus soft non-tender.    Musculoskeletal: Normal range of motion.  Neurological: She is alert and oriented to person, place, and time. Gait normal.  Skin: Skin is warm and dry.  Psychiatric: Affect normal.  Nursing note and vitals reviewed.    NST: FHR baseline 145 bpm, Variability: moderate, Accelerations:present, Decelerations:  Absent= Cat 1-2 @ times with minimal varability/Reactive with scalp stimulation UC:   regular, every 2-4 minutes, lasting 90-120 seconds SVE:   Dilation: 7 Effacement (%): 100 Station: 0 Exam by:: J.Shaquille Murdy,CNM, vertex verified by fetal sutures.  Leopold's: Position vertex, EFW 7.5lbs via leopold's.   Discussed R/A/B of AROM, pt verbalized desired for AROM and consented. AROM per mothers request, light amount of bloody clots expressed. Fetus tolerated well.   Labs: Results for orders placed or performed during the hospital encounter of 01/31/19 (from the past 24 hour(s))  CBC     Status: Abnormal   Collection Time: 01/31/19  9:29 AM  Result Value Ref Range   WBC 13.3 (H) 4.0 - 10.5 K/uL   RBC 4.29 3.87 - 5.11 MIL/uL   Hemoglobin 14.2 12.0 - 15.0 g/dL   HCT 43.1 36.0 - 46.0 %   MCV 100.5 (H)  80.0 - 100.0 fL   MCH 33.1 26.0 - 34.0 pg   MCHC 32.9 30.0 - 36.0 g/dL   RDW 14.6 11.5 - 15.5 %   Platelets 175 150 - 400 K/uL   nRBC 0.0 0.0 - 0.2 %    Imaging:  No results found.  MAU Course: Orders Placed This Encounter  Procedures  . SARS Coronavirus 2 (CEPHEID - Performed in [place] hospital lab), [facility]  . CBC  . RPR  . Diet clear liquid Room service appropriate? Yes; Fluid consistency: Thin  . Contraction - monitoring  . External fetal heart monitoring  .  Vaginal exam  . Vital signs  . Notify Physician  . Activity as tolerated  . Fetal monitoring per unit policy  . Cervical Exam  . Measure blood pressure post delivery every 15 min x 1 hour then every 30 min x 1 hour  . Fundal check post delivery every 15 min x 1 hour then every 30 min x 1 hour  . If Rapid HIV test positive or known HIV positive: initiate AZT orders  . May in and out cath x 2 for inability to void  . Insert foley catheter  . Discontinue foley prior to vaginal delivery  . Patient may have epidural upon request  . Full code  . Type and screen [place]  . Insert and maintain IV Line  . Admit to Inpatient (patient's expected length of stay will be greater than 2 midnights or inpatient only procedure)   Meds ordered this encounter  Medications  . lactated ringers infusion  . oxytocin (PITOCIN) IV BOLUS FROM BAG  . oxytocin (PITOCIN) IV infusion 40 units in NS 1000 mL - Premix  . lactated ringers infusion 500-1,000 mL  . acetaminophen (TYLENOL) tablet 650 mg  . oxyCODONE-acetaminophen (PERCOCET/ROXICET) 5-325 MG per tablet 1 tablet  . oxyCODONE-acetaminophen (PERCOCET/ROXICET) 5-325 MG per tablet 2 tablet  . sodium phosphate (FLEET) 7-19 GM/118ML enema 1 enema  . ondansetron (ZOFRAN) injection 4 mg  . sodium citrate-citric acid (ORACIT) solution 30 mL  . lidocaine (PF) (XYLOCAINE) 1 % injection 30 mL  . oxytocin (PITOCIN) injection 10 Units    Assessment/Plan: Carrie Coffey is a 33 y.o. female, [phone], IUP at 39.5 weeks, presenting for spontaneous labor and desired for natural delivery. PNH consist with prior LGA babies, anxiety no meds, subserosal maternal left fibroid, and +flu during pregnancy tx with tamiflu in [place]. Pt endorse + Fm. Denies vaginal leakage. Denies vaginal bleeding. Denies feeling cxt's. AROM, bloody now.   FWB: Cat 1-2 with minimal variability Fetal Tracing, Reactive with fetal scalp stim.  Plan: Admit to [facility] per consult with Dr Landry Mellow Routine [facility] orders Pain med/epidural prn Anticipate labor progression   Noralyn Pick NP-C, CNM, MSN 01/31/2019, 10:24 AM

## 2019-02-01 LAB — CBC
HCT: 38.1 % (ref 36.0–46.0)
Hemoglobin: 12.6 g/dL (ref 12.0–15.0)
MCH: 32.9 pg (ref 26.0–34.0)
MCHC: 33.1 g/dL (ref 30.0–36.0)
MCV: 99.5 fL (ref 80.0–100.0)
Platelets: 183 10*3/uL (ref 150–400)
RBC: 3.83 MIL/uL — ABNORMAL LOW (ref 3.87–5.11)
RDW: 14.6 % (ref 11.5–15.5)
WBC: 15.9 10*3/uL — ABNORMAL HIGH (ref 4.0–10.5)
nRBC: 0 % (ref 0.0–0.2)

## 2019-02-01 LAB — RPR: RPR Ser Ql: NONREACTIVE

## 2019-02-01 MED ORDER — IBUPROFEN 600 MG PO TABS: 600.0000 mg | ORAL_TABLET | Freq: Four times a day (QID) | ORAL | 0 refills | Status: DC

## 2019-02-01 NOTE — Lactation Note (Signed)
This note was copied from a baby's chart. Lactation Consultation Note Experienced BF mom BF her 45 hr old baby. Mom states baby is cluster feeding. Mom hopes for d/c home today. States BF going well. Mom has 33 yr old and 2 yr old that she BF each for 1 yr. Reviewed engorgement and milk storage w/mom. Mom feeding baby swaddled. Encouraged STS. Mom unwrapped baby. Discussed I&O, STS, cluster feeding, and breast massage while feeding. Baby had a good latch. Mom denies pain other than cramping. Mom states she doesn't need [place] visit before d/c home. Encouraged to call for assistance or questions. Lactation brochure given.  Patient Name: Carrie Coffey NOMVE'H Date: 02/01/2019 Reason for consult: Initial assessment;Term   Maternal Data Has patient been taught Hand Expression?: Yes Does the patient have breastfeeding experience prior to this delivery?: Yes  Feeding Feeding Type: Breast Fed  LATCH Score Latch: Grasps breast easily, tongue down, lips flanged, rhythmical sucking.  Audible Swallowing: Spontaneous and intermittent  Type of Nipple: Everted at rest and after stimulation  Comfort (Breast/Nipple): Soft / non-tender  Hold (Positioning): No assistance needed to correctly position infant at breast.  LATCH Score: 10  Interventions Interventions: Breast feeding basics reviewed;Skin to skin;Breast massage  Lactation Tools Discussed/Used WIC Program: No   Consult Status Consult Status: Complete Date: 02/01/19    Theodoro Kalata 02/01/2019, 3:31 AM

## 2019-02-01 NOTE — Progress Notes (Signed)
CSW received consult for hx of Anxiety and Depression.  CSW met with MOB to offer support and complete assessment.    When CSW arrived, MOB was bonding with infant as evidence in engaging in breastfeeding; MOB and infant appeared comfortable. MOB was polite and easy to engage.    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  MOB also shared that she has a great support team and she feels comfortable seeking help if help is warranted.  CSW recommends self-evaluation during the postpartum time period using the New Mom Checklist from Postpartum Progress and encouraged MOB to contact a medical professional if symptoms are noted at any time; MOB agreed.   CSW identifies no further need for intervention and no barriers to discharge at this time.  Laurey Arrow, MSW, LCSW Clinical Social Work [phone]

## 2019-02-01 NOTE — Discharge Summary (Signed)
SVD OB Discharge Summary     Patient Name: Carrie Coffey DOB: 1986-05-09 MRN: [id number]  Date of admission: 01/31/2019 Delivering MD: Noralyn Pick  Date of delivery: 01/31/2019 Type of delivery: SVD  Newborn Data: Sex: Baby Female  Circumcision: Circ performed in hospital. Dr Landry Mellow advised mother of possible need for consult with urologist.  Live born female  Birth Weight: 7 lb 10.1 oz (3460 g) APGAR: 99, 22  Newborn Delivery   Birth date/time: 01/31/2019 10:28:00 Delivery type: Vaginal, Spontaneous      Feeding: breast Infant being discharge to home with mother in stable condition.   Admitting diagnosis: Preg Intrauterine pregnancy: [redacted]w[redacted]d     Secondary diagnosis:  Active Problems:   Normal labor   Normal postpartum course                                Complications: None                                                              Intrapartum Procedures: spontaneous vaginal delivery Postpartum Procedures: none Complications-Operative and Postpartum: none Augmentation: AROM   History of Present Illness: Ms. Carrie Coffey is a 33 y.o. female, [id number], who presents at 110w6d weeks gestation. The patient has been followed at  [facility] Gynecology  Her pregnancy has been complicated by:  Patient Active Problem List   Diagnosis Date Noted  . Normal postpartum course 02/01/2019  . Normal labor 01/31/2019  . Labor and delivery indication for care or intervention 02/19/2016  . First degree perineal laceration during delivery 07/25/2014  . Vaginal delivery 07/23/2014  . Skin tag of vulva 07/23/2014  . Ectopic pregnancy 08/01/2013    Hospital course:  Onset of Labor With Vaginal Delivery     34 y.o. yo [id number] at [redacted]w[redacted]d was admitted in Active Labor on 01/31/2019. Patient had an uncomplicated labor course as follows:  Membrane Rupture Time/Date: 10:08 AM ,01/31/2019   Intrapartum Procedures: Episiotomy: None [1]                                          Lacerations:  None [1]  Patient had a delivery of a Viable infant. 01/31/2019  Information for the patient's newborn:  Devlyn, Parish [[id number]]  Delivery Method: Vaginal, Spontaneous(Filed from Delivery Summary)     Pateint had an uncomplicated postpartum course.  She is ambulating, tolerating a regular diet, passing flatus, and urinating well. Patient is discharged home in stable condition on 02/01/19.  Postpartum Day # 1 : S/P NSVD due to spontaneous labor. Patient up ad lib, denies syncope or dizziness. Reports consuming regular diet without issues and denies N/V. Patient reports 0 bowel movement + passing flatus.  Denies issues with urination and reports bleeding is "lighter."  Patient is breastfeeding and reports going well.  Desires possible lyletta IUD for postpartum contraception.  Pain is being appropriately managed with use of po meds. Pt wants early discharge today, Dr Landry Mellow consented. Pt stable   Physical exam  Vitals:   01/31/19 1720 01/31/19 2100 02/01/19 0034 02/01/19 0504  BP: 122/60 114/65 118/73 117/68  Pulse: 84 98 97 81  Resp: 18 16 16 16   Temp: 98.2 F (36.8 C) 98.2 F (36.8 C) 98.3 F (36.8 C) 98.1 F (36.7 C)  TempSrc: Oral Oral Oral Oral  SpO2: 98%     Weight:      Height:       General: alert, cooperative and no distress Lochia: appropriate Uterine Fundus: firm Perineum: intact DVT Evaluation: No evidence of DVT seen on physical exam. Negative Homan's sign. No cords or calf tenderness. No significant calf/ankle edema.  Labs: Lab Results  Component Value Date   WBC 15.9 (H) 02/01/2019   HGB 12.6 02/01/2019   HCT 38.1 02/01/2019   MCV 99.5 02/01/2019   PLT 183 02/01/2019   CMP Latest Ref Rng & Units 08/05/2013  Glucose 70 - 99 mg/dL 100(H)  BUN 6 - 23 mg/dL 8  Creatinine 0.50 - 1.10 mg/dL 0.73  Sodium 137 - 147 mEq/L 137  Potassium 3.7 - 5.3 mEq/L 3.8  Chloride 96 - 112 mEq/L 103  CO2 19 - 32 mEq/L 20  Calcium 8.4 - 10.5 mg/dL 9.1   Total Protein 6.0 - 8.3 g/dL 7.7  Total Bilirubin 0.3 - 1.2 mg/dL 0.5  Alkaline Phos 39 - 117 U/L 45  AST 0 - 37 U/L 25  ALT 0 - 35 U/L 20    Date of discharge: 02/01/2019 Discharge Diagnoses: Term Pregnancy-delivered Discharge instruction: per After Visit Summary and "Baby and Me Booklet".  After visit meds:   Activity:           unrestricted and pelvic rest Advance as tolerated. Pelvic rest for 6 weeks.  Diet:                routine Medications: PNV and Ibuprofen Postpartum contraception: IUD Lyletta Condition:  Pt discharge to home with baby in stable   Meds: Allergies as of 02/01/2019      Reactions   Apple Hives, Swelling   Food Hives, Swelling, Other (See Comments)   Pt states that she is allergic to strawberries.     Pear Hives, Swelling   Pseudoephedrine Hives      Medication List    TAKE these medications   ibuprofen 600 MG tablet Commonly known as: ADVIL Take 1 tablet (600 mg total) by mouth every 6 (six) hours.       Discharge Follow Up:  Follow-up [facility] Follow up.   Specialty: Obstetrics and Gynecology Contact information: [address] [place] [address] [place], NP-C, CNM 02/01/2019, 8:43 AM  Noralyn Pick, [place]

## 2019-04-27 ENCOUNTER — Encounter (HOSPITAL_COMMUNITY): Payer: Self-pay

## 2020-07-26 IMAGING — US US MFM OB COMP + 14 WK
1 series · 14 of 28 positions shown · non-contrast
Comparison: none

[Series 1: us mfm ob comp + 14 wk · 65 acquisitions, 14 frames shown]
[im 3/65]
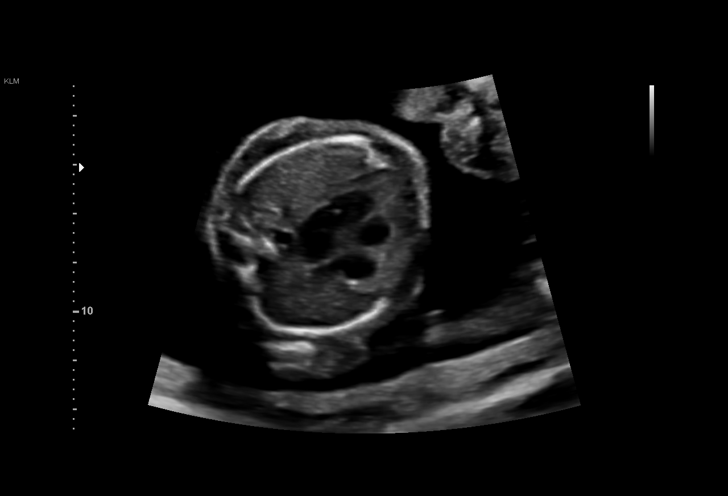
[im 8/65]
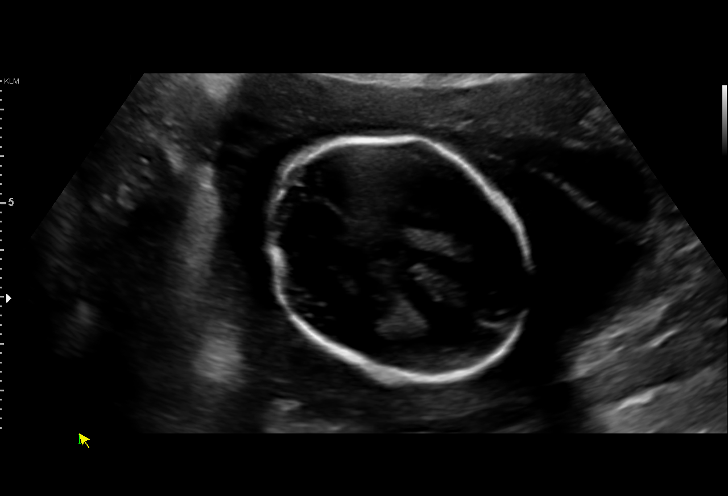
[im 12/65]
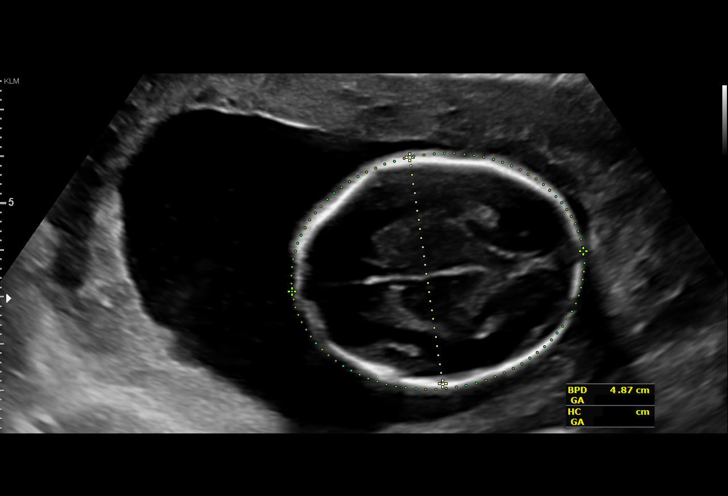
[im 17/65]
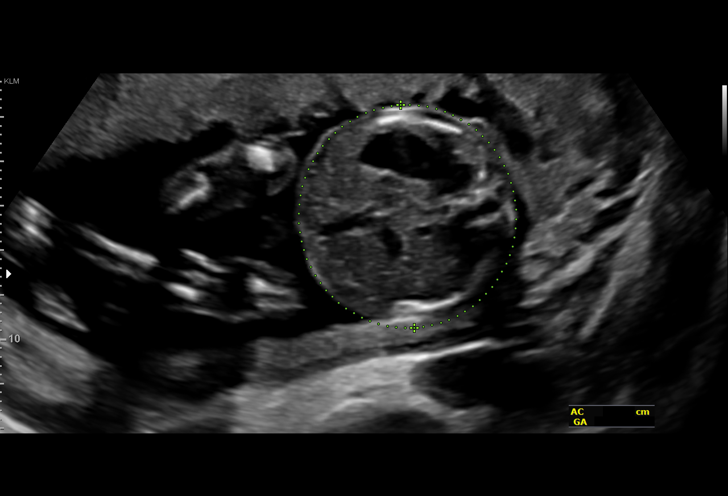
[im 22/65]
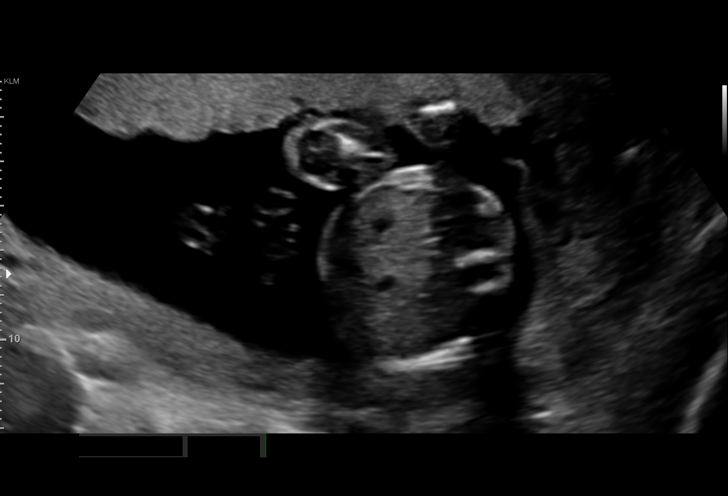
[im 27/65]
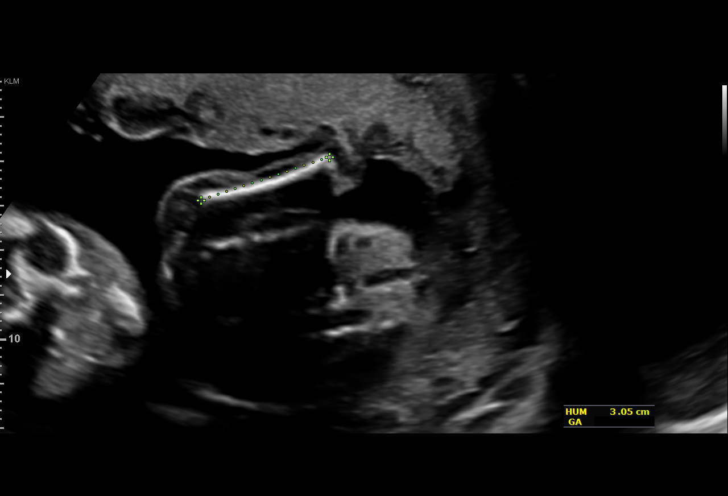
[im 31/65]
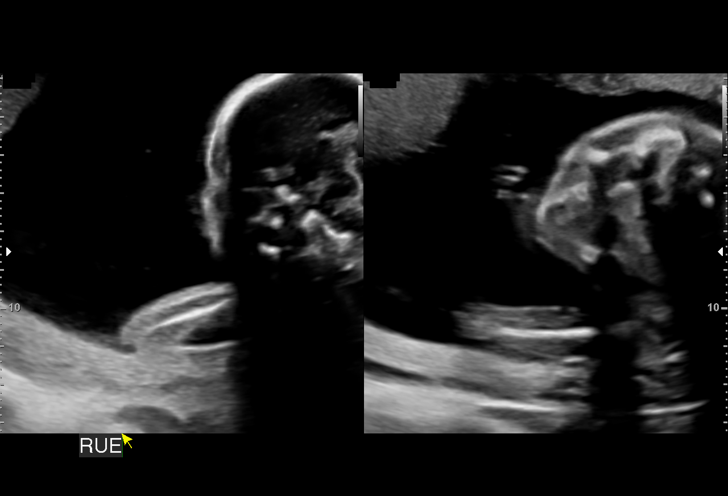
[im 36/65]
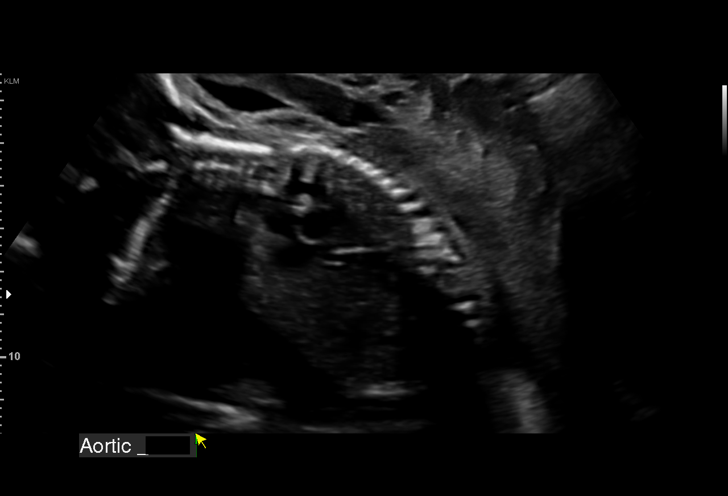
[im 41/65]
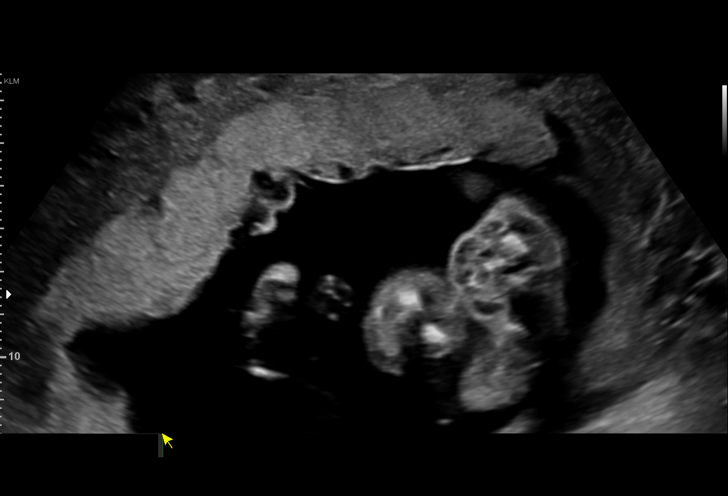
[im 46/65]
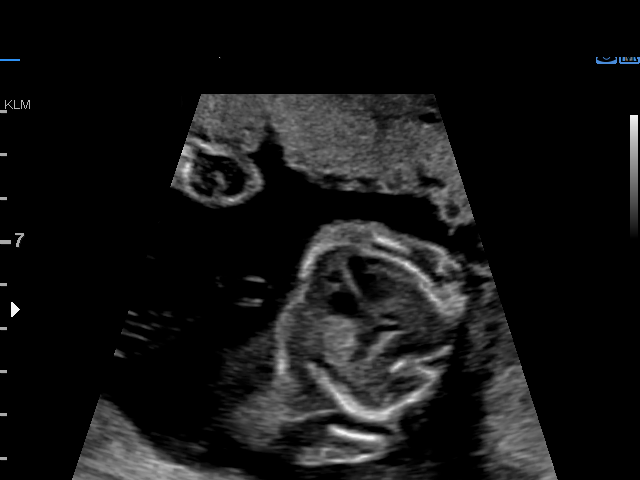
[im 50/65]
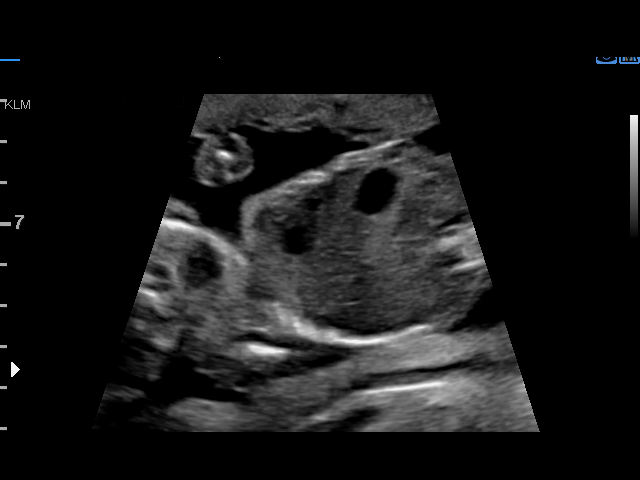
[im 55/65]
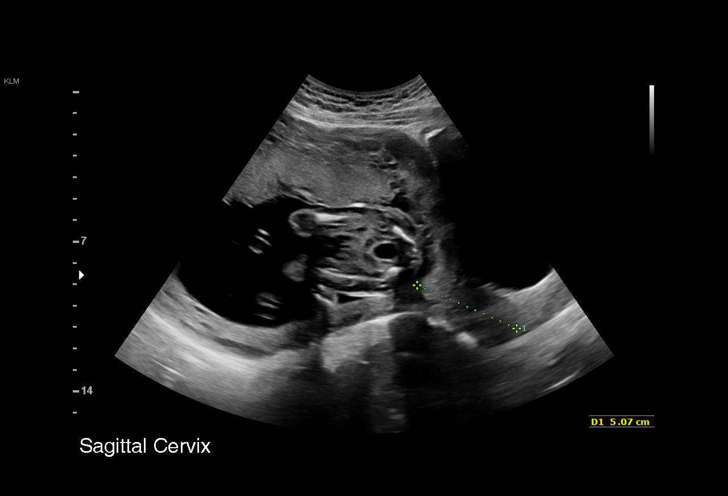
[im 60/65]
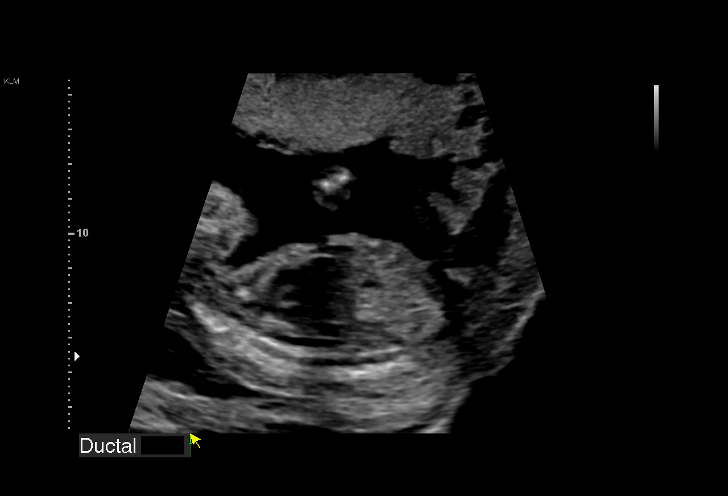
[im 65/65]
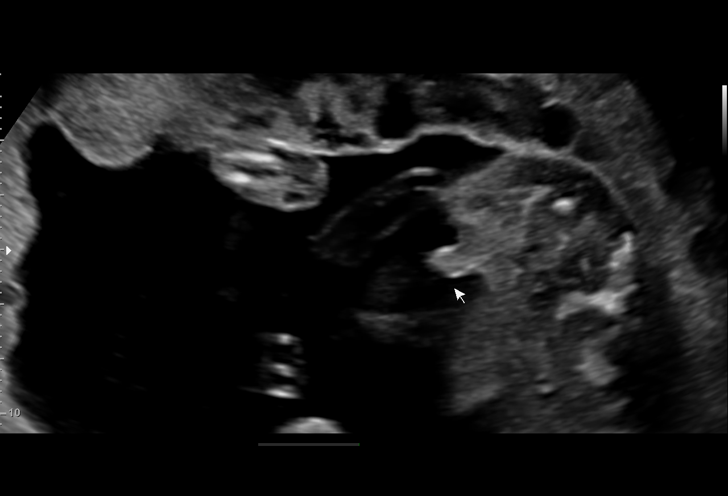

[14 of 28 positions shown; findings below may reference images not displayed]

#130

  1  US MFM OB COMP + 14 WK                76805.01     FALLON JIM
 ----------------------------------------------------------------------

 ----------------------------------------------------------------------
Indications

  Encounter for antenatal screening for
  malformations (neg 1st trimester screen)
  20 weeks gestation of pregnancy
 ----------------------------------------------------------------------
Fetal Evaluation

 Num Of Fetuses:          1
 Cardiac Activity:        Observed
 Presentation:            Cephalic
 Placenta:                Anterior
 P. Cord Insertion:       Visualized

 Amniotic Fluid
 AFI FV:      Within normal limits
Biometry

 BPD:      47.8   mm     G. Age:  20w 3d         63  %    CI:         72.05  %    70 - 86
                                                          FL/HC:       18.1  %    16.8 -
 HC:      179.2   mm     G. Age:  20w 2d         52  %    HC/AC:       1.15       1.09 -
 AC:      155.7   mm     G. Age:  20w 5d         64  %    FL/BPD:      67.8  %
 FL:       32.4   mm     G. Age:  20w 1d         41  %    FL/AC:       20.8  %    20 - 24
 HUM:      31.3   mm     G. Age:  20w 3d         60  %
 CER:      21.6   mm     G. Age:  20w 4d         57  %

 Est. FW:     354   gm    0 lb 12 oz      53  %
OB History
 Gravidity:     8         Term:  2          Prem:  0        SAB:   2
 TOP:           2       Ectopic: 1         Living: 2
Gestational Age

 Clinical EDD:   20w 1d                                         EDD:  02/02/19
 U/S Today:      20w 3d                                         EDD:  01/31/19
 Best:           20w 1d    Det. By:  Clinical EDD               EDD:  02/02/19
Anatomy

 Cranium:                Appears normal         Aortic Arch:            Appears normal
 Cavum:                  Appears normal         Ductal Arch:            Appears normal
 Ventricles:             Appears normal         Diaphragm:              Appears normal
 Choroid Plexus:         Appears normal         Stomach:                Appears normal, left
                                                                        sided
 Cerebellum:             Appears normal         Abdomen:                Appears normal
 Posterior Fossa:        Appears normal         Abdominal Wall:         Appears nml (cord
                                                                        insert, abd wall)
 Nuchal Fold:            Appears normal         Cord Vessels:           Appears normal (3
                                                                        vessel cord)
 Face:                   Appears normal         Kidneys:                Appear normal
                         (orbits and profile)
 Lips:                   Appears normal         Bladder:                Appears normal
 Thoracic:               Appears normal         Spine:                  Ltd views no
                                                                        intracranial signs of
                                                                        NTD
 Heart:                  Appears normal         Upper Extremities:      Appears normal
                         (4CH, axis, and situs)
 RVOT:                   Appears normal         Lower Extremities:      Appears normal
 LVOT:                   Appears normal

 Other:   Parents do not wish to know sex of fetus. Heels and Left 5th digit
          visualized. Technically difficult due to fetal position.
Cervix Uterus Adnexa

 Cervix
 Length:               5  cm.
 Normal appearance by transabdominal scan.
Impression

 Normal interval growth.  No ultrasonic evidence of structural
 fetal anomalies.
 Suboptimal views of the fetal anatomy secondary to fetal
 position.
Recommendations

 Follow up anatomy in 4 weeks.

## 2020-08-11 ENCOUNTER — Other Ambulatory Visit: Payer: Self-pay | Admitting: Obstetrics and Gynecology

## 2020-08-11 ENCOUNTER — Other Ambulatory Visit (HOSPITAL_COMMUNITY)
Admission: RE | Admit: 2020-08-11 | Discharge: 2020-08-11 | Disposition: A | Discharge status: Home / Self Care | Payer: BC Managed Care – PPO | Source: Ambulatory Visit | Attending: Obstetrics and Gynecology | Admitting: Obstetrics and Gynecology

## 2020-08-11 ENCOUNTER — Encounter (HOSPITAL_COMMUNITY): Payer: Self-pay | Admitting: Obstetrics and Gynecology

## 2020-08-11 ENCOUNTER — Other Ambulatory Visit: Payer: Self-pay

## 2020-08-11 DIAGNOSIS — F419 Anxiety disorder, unspecified: Secondary | ICD-10-CM | POA: Diagnosis present

## 2020-08-11 DIAGNOSIS — Z20822 Contact with and (suspected) exposure to covid-19: Secondary | ICD-10-CM | POA: Insufficient documentation

## 2020-08-11 DIAGNOSIS — Z6790 Unspecified blood type, Rh positive: Secondary | ICD-10-CM | POA: Diagnosis not present

## 2020-08-11 DIAGNOSIS — Z01812 Encounter for preprocedural laboratory examination: Secondary | ICD-10-CM | POA: Insufficient documentation

## 2020-08-11 DIAGNOSIS — Z8759 Personal history of other complications of pregnancy, childbirth and the puerperium: Secondary | ICD-10-CM | POA: Diagnosis not present

## 2020-08-11 DIAGNOSIS — N96 Recurrent pregnancy loss: Secondary | ICD-10-CM | POA: Diagnosis present

## 2020-08-11 DIAGNOSIS — O021 Missed abortion: Secondary | ICD-10-CM | POA: Diagnosis present

## 2020-08-11 LAB — SARS CORONAVIRUS 2 (TAT 6-24 HRS): SARS Coronavirus 2: NEGATIVE

## 2020-08-11 NOTE — H&P (Cosign Needed)
Carrie Coffey is a 35 y.o. female, [id number] at 7 1/7 weeks, presenting for scheduled D&E due to missed AB diagnosed in office on 08/11/20.  Had missed period visit at [facility] 07/20/20, with US showing SIUP at 6 6/7 weeks, with small [facility].  Seen again 1/31 for recurrence of small amount bleeding, with FHR audible with doppler and visible on bedside US.  Called after-hours line on 2/2 with bleeding heavier than spotting, and was scheduled for office visit today with [place].  [place] today showed non-viable fetus measuring 7 5/7 weeks, no [place] noted.  Bleeding minimal, denied cramping.  Options for management of the SAB were reviewed, with patient electing to schedule D&E as soon as possible.  Patient denies cramping or bleeding.  Known O+ blood type from prior pregnancy.  GC/chlamydia negative 07/20/20.  Patient Active Problem List   Diagnosis Date Noted  . Anxiety 08/11/2020  . 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 History of ectopic pregnancy 08/11/2020     OB History    Gravida  8   Para  2   Term  2   Preterm      AB  5   Living  2     SAB  2   IAB  2   Ectopic  1   Multiple  0   Live Births  2         2005--TAB 6 weeks 2009--SAB 6 weeks 2011--TAB 6 weeks 2014--SAB 6 weeks 2015--Ectopic, left salpingectomy, 9 4/7 weeks 2016--SVB, 40 5/7 weeks, 7+14, female 2017--SVB, 40 3/7 weeks, 9+13, female 2020--SVB, 39 5/7 weeks, female  Past Medical History:  Diagnosis Date  . Anxiety   . BV (bacterial vaginosis)   . H/O joint problems   . Infection    UTI  . Psoriasis    Past Surgical History:  Procedure Laterality Date  . DILATION AND CURETTAGE OF UTERUS    . LAPAROSCOPY N/A 09/10/2013   Procedure: LAPAROSCOPY OPERATIVE;  Surgeon: Annalee Genta, DO;  Location: [facility];  Service: Gynecology;  Laterality: N/A;  . UNILATERAL SALPINGECTOMY Left 09/10/2013   Procedure: UNILATERAL SALPINGECTOMY;  Surgeon: Annalee Genta, DO;  Location: [facility];  Service: Gynecology;   Laterality: Left;   Family History: Father glaucoma  Social History:  reports that she has never smoked. She has never used smokeless tobacco. She reports previous alcohol use of about 1.0 standard drink of alcohol per week. She reports that she does not use drugs.  Patient is African-American, married to Carrie Coffey, who is involved and supportive.  Patient is graduate-educated, employed as a Engineer, water.  ROS:  Denies cramping, bleeding, or any other issues.  Allergies  Allergen Reactions  . Allegra Allergy [Fexofenadine Hcl] Hives  . Apple Hives and Swelling  . Food Hives, Swelling and Other (See Comments)    Pt states that she is allergic to strawberries.    . Pear Hives and Swelling  . Pseudoephedrine Hives       Height 5\' 1"  (1.549 m), last menstrual period 06/03/2020, unknown if currently breastfeeding.  Chest clear Heart RRR without murmur Abd gravid, NT, FH 8 weeks Pelvic: Cervix closed, NT Ext: WNL     Assessment/Plan: Missed AB  Rh positive Hx 3 prior SABs  Plan: Ad[facility] to [facility] for scheduled D&E/Suction D&C for missed AB Routine [facility] pre-op orders Patient may be interested in chromosomal studies on POC--Dr. Mancel Bale will f/u with patient regarding this. Support to patient for loss.  Donnel Saxon, CNM, [place] 08/11/2020, 8:58 PM  Discussed procedure with patient and answered questions.  She declined chromosomal analysis.  Risks benefits alternatives reviewed including but not limited to bleeding infection and injury.  Consent signed and witnessed.

## 2020-08-11 NOTE — Progress Notes (Signed)
Carrie Coffey denies chest pain or shortness of breath.  Patient denies having any s/s of Covid in her household.  Patient denies any known exposure to Covid. Carrie Coffey will be tested for Covid today, she is aware that she will need to quarantine with only the people that live in her home.

## 2020-08-12 ENCOUNTER — Ambulatory Visit (HOSPITAL_COMMUNITY): Payer: BC Managed Care – PPO | Admitting: Certified Registered Nurse Anesthetist

## 2020-08-12 ENCOUNTER — Other Ambulatory Visit: Payer: Self-pay

## 2020-08-12 ENCOUNTER — Encounter (HOSPITAL_COMMUNITY)
Admission: RE | Disposition: A | Discharge status: Home / Self Care | Payer: Self-pay | Source: Home / Self Care | Attending: Obstetrics and Gynecology

## 2020-08-12 ENCOUNTER — Ambulatory Visit (HOSPITAL_COMMUNITY)
Admission: RE | Admit: 2020-08-12 | Discharge: 2020-08-12 | Disposition: A | Discharge status: Home / Self Care | Payer: BC Managed Care – PPO | Attending: Obstetrics and Gynecology | Admitting: Obstetrics and Gynecology

## 2020-08-12 ENCOUNTER — Encounter (HOSPITAL_COMMUNITY): Payer: Self-pay | Admitting: Obstetrics and Gynecology

## 2020-08-12 DIAGNOSIS — N96 Recurrent pregnancy loss: Secondary | ICD-10-CM | POA: Diagnosis present

## 2020-08-12 DIAGNOSIS — O021 Missed abortion: Secondary | ICD-10-CM | POA: Diagnosis not present

## 2020-08-12 DIAGNOSIS — Z8759 Personal history of other complications of pregnancy, childbirth and the puerperium: Secondary | ICD-10-CM | POA: Insufficient documentation

## 2020-08-12 DIAGNOSIS — Z6790 Unspecified blood type, Rh positive: Secondary | ICD-10-CM | POA: Insufficient documentation

## 2020-08-12 DIAGNOSIS — F419 Anxiety disorder, unspecified: Secondary | ICD-10-CM | POA: Diagnosis present

## 2020-08-12 DIAGNOSIS — Z20822 Contact with and (suspected) exposure to covid-19: Secondary | ICD-10-CM | POA: Insufficient documentation

## 2020-08-12 HISTORY — PX: DILATION AND EVACUATION: SHX1459

## 2020-08-12 LAB — TYPE AND SCREEN
ABO/RH(D): O POS
Antibody Screen: NEGATIVE

## 2020-08-12 LAB — CBC
HCT: 38.4 % (ref 36.0–46.0)
Hemoglobin: 12.9 g/dL (ref 12.0–15.0)
MCH: 33.2 pg (ref 26.0–34.0)
MCHC: 33.6 g/dL (ref 30.0–36.0)
MCV: 98.7 fL (ref 80.0–100.0)
Platelets: 310 10*3/uL (ref 150–400)
RBC: 3.89 MIL/uL (ref 3.87–5.11)
RDW: 12.9 % (ref 11.5–15.5)
WBC: 5.7 10*3/uL (ref 4.0–10.5)
nRBC: 0 % (ref 0.0–0.2)

## 2020-08-12 SURGERY — DILATION AND EVACUATION, UTERUS
Anesthesia: Monitor Anesthesia Care | Site: Vagina

## 2020-08-12 MED ORDER — LACTATED RINGERS IV SOLN: INTRAVENOUS | Status: DC

## 2020-08-12 MED ORDER — IBUPROFEN 600 MG PO TABS: 600.0000 mg | ORAL_TABLET | Freq: Four times a day (QID) | ORAL | 0 refills | Status: DC | PRN

## 2020-08-12 MED ORDER — LIDOCAINE HCL 2 % IJ SOLN
INTRAMUSCULAR | Status: DC | PRN
  Administered 2020-08-12: 9 mL

## 2020-08-12 MED ORDER — LIDOCAINE HCL 2 % IJ SOLN
INTRAMUSCULAR | Status: AC
  Filled 2020-08-12: qty 20

## 2020-08-12 MED ORDER — MIDAZOLAM HCL 2 MG/2ML IJ SOLN
INTRAMUSCULAR | Status: AC
  Filled 2020-08-12: qty 2

## 2020-08-12 MED ORDER — FENTANYL CITRATE (PF) 250 MCG/5ML IJ SOLN
INTRAMUSCULAR | Status: DC | PRN
  Administered 2020-08-12 (×2): 25 ug via INTRAVENOUS
  Administered 2020-08-12: 50 ug via INTRAVENOUS

## 2020-08-12 MED ORDER — FENTANYL CITRATE (PF) 250 MCG/5ML IJ SOLN
INTRAMUSCULAR | Status: AC
  Filled 2020-08-12: qty 5

## 2020-08-12 MED ORDER — CEFAZOLIN SODIUM-DEXTROSE 2-4 GM/100ML-% IV SOLN
2.0000 g | INTRAVENOUS | Status: AC
  Administered 2020-08-12: 2 g via INTRAVENOUS
  Filled 2020-08-12: qty 100

## 2020-08-12 MED ORDER — CHLORHEXIDINE GLUCONATE 0.12 % MT SOLN: 15.0000 mL | Freq: Once | OROMUCOSAL | Status: AC

## 2020-08-12 MED ORDER — MIDAZOLAM HCL 2 MG/2ML IJ SOLN
INTRAMUSCULAR | Status: DC | PRN
  Administered 2020-08-12: 2 mg via INTRAVENOUS

## 2020-08-12 MED ORDER — PROPOFOL 500 MG/50ML IV EMUL
INTRAVENOUS | Status: DC | PRN
  Administered 2020-08-12: 100 ug/kg/min via INTRAVENOUS

## 2020-08-12 MED ORDER — PROPOFOL 10 MG/ML IV BOLUS
INTRAVENOUS | Status: AC
  Filled 2020-08-12: qty 20

## 2020-08-12 MED ORDER — PROPOFOL 10 MG/ML IV BOLUS
INTRAVENOUS | Status: DC | PRN
  Administered 2020-08-12 (×3): 20 mg via INTRAVENOUS

## 2020-08-12 MED ORDER — POVIDONE-IODINE 10 % EX SWAB
2.0000 "application " | Freq: Once | CUTANEOUS | Status: AC
  Administered 2020-08-12: 2 via TOPICAL

## 2020-08-12 MED ORDER — CHLORHEXIDINE GLUCONATE 0.12 % MT SOLN
OROMUCOSAL | Status: AC
  Administered 2020-08-12: 15 mL via OROMUCOSAL
  Filled 2020-08-12: qty 15

## 2020-08-12 MED ORDER — ONDANSETRON HCL 4 MG/2ML IJ SOLN
INTRAMUSCULAR | Status: DC | PRN
  Administered 2020-08-12: 4 mg via INTRAVENOUS

## 2020-08-12 MED ORDER — OXYCODONE-ACETAMINOPHEN 5-325 MG PO TABS: 1.0000 | ORAL_TABLET | Freq: Four times a day (QID) | ORAL | 0 refills | Status: DC | PRN

## 2020-08-12 SURGICAL SUPPLY — 23 items
CATH ROBINSON RED A/P 16FR (CATHETERS) ×3 IMPLANT
DECANTER SPIKE VIAL GLASS SM (MISCELLANEOUS) ×3 IMPLANT
FILTER UTR ASPR ASSEMBLY (MISCELLANEOUS) ×3 IMPLANT
GAUZE 4X4 16PLY RFD (DISPOSABLE) ×3 IMPLANT
GLOVE BIO SURGEON STRL SZ7.5 (GLOVE) ×3 IMPLANT
GLOVE BIOGEL PI IND STRL 7.0 (GLOVE) ×2 IMPLANT
GLOVE BIOGEL PI IND STRL 7.5 (GLOVE) ×2 IMPLANT
GLOVE BIOGEL PI INDICATOR 7.0 (GLOVE) ×1
GLOVE BIOGEL PI INDICATOR 7.5 (GLOVE) ×1
GOWN STRL REUS W/ TWL LRG LVL3 (GOWN DISPOSABLE) ×4 IMPLANT
GOWN STRL REUS W/TWL LRG LVL3 (GOWN DISPOSABLE) ×2
HOSE CONNECTING 18IN BERKELEY (TUBING) ×3 IMPLANT
KIT BERKELEY 1ST TRIMESTER 3/8 (MISCELLANEOUS) ×6 IMPLANT
NS IRRIG 1000ML POUR BTL (IV SOLUTION) ×3 IMPLANT
PACK VAGINAL MINOR WOMEN LF (CUSTOM PROCEDURE TRAY) ×3 IMPLANT
PAD OB MATERNITY 4.3X12.25 (PERSONAL CARE ITEMS) ×3 IMPLANT
SET BERKELEY SUCTION TUBING (SUCTIONS) ×3 IMPLANT
TOWEL GREEN STERILE FF (TOWEL DISPOSABLE) ×6 IMPLANT
UNDERPAD 30X36 HEAVY ABSORB (UNDERPADS AND DIAPERS) ×3 IMPLANT
VACURETTE 10 RIGID CVD (CANNULA) IMPLANT
VACURETTE 7MM CVD STRL WRAP (CANNULA) ×3 IMPLANT
VACURETTE 8 RIGID CVD (CANNULA) IMPLANT
VACURETTE 9 RIGID CVD (CANNULA) IMPLANT

## 2020-08-12 NOTE — OR Nursing (Signed)
Pt is awake,alert and oriented. Pt is in NAD at this time. Pt and family verbalized understanding of poc and discharge instructions. instructions given to family and reviewed prior to discharge.Pt is ambulatory to wheelchair with stand by assist but not needed with steady gait.  Pt taken to front lobby and placed in car with family.   

## 2020-08-12 NOTE — Anesthesia Preprocedure Evaluation (Signed)
Anesthesia Evaluation  Patient identified by MRN, date of birth, ID band Patient awake    Reviewed: Allergy & Precautions, NPO status , Patient's Chart, lab work & pertinent test results  History of Anesthesia Complications Negative for: history of anesthetic complications  Airway Mallampati: II  TM Distance: >3 FB Neck ROM: Full    Dental  (+) Teeth Intact   Pulmonary neg pulmonary ROS,    Pulmonary exam normal        Cardiovascular negative cardio ROS Normal cardiovascular exam     Neuro/Psych negative neurological ROS  negative psych ROS   GI/Hepatic negative GI ROS, Neg liver ROS,   Endo/Other  negative endocrine ROS  Renal/GU negative Renal ROS  negative genitourinary   Musculoskeletal negative musculoskeletal ROS (+)   Abdominal   Peds  Hematology negative hematology ROS (+)   Anesthesia Other Findings   Reproductive/Obstetrics (+) Pregnancy (9 week miscarriage)                            Anesthesia Physical Anesthesia Plan  ASA: II  Anesthesia Plan: MAC   Post-op Pain Management:    Induction: Intravenous  PONV Risk Score and Plan: 2 and Propofol infusion, TIVA and Treatment may vary due to age or medical condition  Airway Management Planned: Natural Airway, Nasal Cannula and Simple Face Mask  Additional Equipment: None  Intra-op Plan:   Post-operative Plan:   Informed Consent: I have reviewed the patients History and Physical, chart, labs and discussed the procedure including the risks, benefits and alternatives for the proposed anesthesia with the patient or authorized representative who has indicated his/her understanding and acceptance.       Plan Discussed with:   Anesthesia Plan Comments:         Anesthesia Quick Evaluation

## 2020-08-12 NOTE — Anesthesia Postprocedure Evaluation (Signed)
Anesthesia Post Note  Patient: Carrie Coffey  Procedure(s) Performed: DILATATION AND EVACUATION (N/A Vagina )     Patient location during evaluation: PACU Anesthesia Type: MAC Level of consciousness: awake and alert Pain management: pain level controlled Vital Signs Assessment: post-procedure vital signs reviewed and stable Respiratory status: spontaneous breathing, nonlabored ventilation and respiratory function stable Cardiovascular status: blood pressure returned to baseline and stable Postop Assessment: no apparent nausea or vomiting Anesthetic complications: no   No complications documented.  Last Vitals:  Vitals:   08/12/20 1347 08/12/20 1645  BP: 122/81 126/64  Pulse: 97 90  Resp: 18 (!) 28  Temp: (!) 36.4 C (!) 36.3 C  SpO2: 100% 100%    Last Pain:  Vitals:   08/12/20 1645  TempSrc:   PainSc: 0-No pain                 Lidia Collum

## 2020-08-12 NOTE — Transfer of Care (Signed)
Immediate Anesthesia Transfer of Care Note  Patient: Carrie Coffey  Procedure(s) Performed: DILATATION AND EVACUATION (N/A Vagina )  Patient Location: PACU  Anesthesia Type:MAC  Level of Consciousness: awake, alert  and oriented  Airway & Oxygen Therapy: Patient Spontanous Breathing  Post-op Assessment: Report given to RN and Post -op Vital signs reviewed and stable  Post vital signs: Reviewed and stable  Last Vitals:  Vitals Value Taken Time  BP    Temp    Pulse 88 08/12/20 1642  Resp 23 08/12/20 1642  SpO2 100 % 08/12/20 1642  Vitals shown include unvalidated device data.  Last Pain:  Vitals:   08/12/20 1404  TempSrc:   PainSc: 3       Patients Stated Pain Goal: 3 (62/19/47 1252)  Complications: No complications documented.

## 2020-08-13 ENCOUNTER — Encounter (HOSPITAL_COMMUNITY): Payer: Self-pay | Admitting: Obstetrics and Gynecology

## 2020-08-15 LAB — SURGICAL PATHOLOGY

## 2020-08-15 NOTE — Op Note (Addendum)
Preop Diagnosis: Missed Ab  Postop Diagnosis: Missed Ab  Procedure: Suction Dilation and Curettage  Anesthesia: Monitor Anesthesia Care   Anesthesiologist: Bryson Ha, MD   Attending: Everett Graff, MD   Assistant: N/a  Findings: POCs  Pathology: POCs  Fluids: See flowsheet  UOP: 200 cc  EBL: 5cc  Complications: None  Procedure: The patient was taken to the operating room after the risks benefits and alternatives were discussed with the patient, the patient verbalized understanding and consent signed and witnessed.  The patient was placed under MAC anesthesia, prepped and draped in the normal sterile fashion and a time out was performed.  A bivalve speculum was placed in the patient's vagina and the anterior lip of the cervix grasped with a single-tooth tenaculum. A paracervical block was administered using a total of 10 cc of 2% lidocaine.  The uterus was sounded to 8 cm and a size 7 suction curette was used. Suction curettage was performed until minimal tissue returned. Gentle curettage was performed until a gritty texture was noted. Suction curettage was performed once again to remove any remaining debris. All instruments were removed. The count was correct. The patient was transferred to the recovery room in good condition.

## 2020-08-22 ENCOUNTER — Encounter (HOSPITAL_COMMUNITY): Payer: Self-pay | Admitting: Obstetrics and Gynecology

## 2020-10-27 ENCOUNTER — Other Ambulatory Visit: Payer: Self-pay | Admitting: Obstetrics & Gynecology

## 2020-10-27 ENCOUNTER — Encounter (HOSPITAL_COMMUNITY): Payer: Self-pay | Admitting: Obstetrics & Gynecology

## 2020-10-27 NOTE — Progress Notes (Signed)
Spoke with pt for pre-op call. Pt denies cardiac hx, HTN or Diabetes.    Covid test to be done on arrival to pre-op.

## 2020-10-28 ENCOUNTER — Ambulatory Visit (HOSPITAL_COMMUNITY)
Admission: RE | Admit: 2020-10-28 | Discharge: 2020-10-28 | Disposition: A | Discharge status: Home / Self Care | Payer: BC Managed Care – PPO | Attending: Obstetrics & Gynecology | Admitting: Obstetrics & Gynecology

## 2020-10-28 ENCOUNTER — Ambulatory Visit (HOSPITAL_COMMUNITY): Payer: BC Managed Care – PPO | Admitting: Anesthesiology

## 2020-10-28 ENCOUNTER — Encounter (HOSPITAL_COMMUNITY)
Admission: RE | Disposition: A | Discharge status: Home / Self Care | Payer: Self-pay | Source: Home / Self Care | Attending: Obstetrics & Gynecology

## 2020-10-28 ENCOUNTER — Other Ambulatory Visit: Payer: Self-pay

## 2020-10-28 DIAGNOSIS — Z20822 Contact with and (suspected) exposure to covid-19: Secondary | ICD-10-CM | POA: Insufficient documentation

## 2020-10-28 DIAGNOSIS — O2621 Pregnancy care for patient with recurrent pregnancy loss, first trimester: Secondary | ICD-10-CM | POA: Diagnosis not present

## 2020-10-28 DIAGNOSIS — Z3A01 Less than 8 weeks gestation of pregnancy: Secondary | ICD-10-CM | POA: Insufficient documentation

## 2020-10-28 DIAGNOSIS — Z888 Allergy status to other drugs, medicaments and biological substances status: Secondary | ICD-10-CM | POA: Diagnosis not present

## 2020-10-28 DIAGNOSIS — N96 Recurrent pregnancy loss: Secondary | ICD-10-CM | POA: Insufficient documentation

## 2020-10-28 DIAGNOSIS — Z791 Long term (current) use of non-steroidal anti-inflammatories (NSAID): Secondary | ICD-10-CM | POA: Insufficient documentation

## 2020-10-28 DIAGNOSIS — Z7982 Long term (current) use of aspirin: Secondary | ICD-10-CM | POA: Insufficient documentation

## 2020-10-28 DIAGNOSIS — Z91018 Allergy to other foods: Secondary | ICD-10-CM | POA: Diagnosis not present

## 2020-10-28 DIAGNOSIS — Z79899 Other long term (current) drug therapy: Secondary | ICD-10-CM | POA: Diagnosis not present

## 2020-10-28 DIAGNOSIS — O021 Missed abortion: Secondary | ICD-10-CM | POA: Insufficient documentation

## 2020-10-28 HISTORY — PX: DILATION AND EVACUATION: SHX1459

## 2020-10-28 LAB — BASIC METABOLIC PANEL
Anion gap: 9 (ref 5–15)
BUN: <5 mg/dL — ABNORMAL LOW (ref 6–20)
CO2: 20 mmol/L — ABNORMAL LOW (ref 22–32)
Calcium: 9.2 mg/dL (ref 8.9–10.3)
Chloride: 110 mmol/L (ref 98–111)
Creatinine, Ser: 0.83 mg/dL (ref 0.44–1.00)
GFR, Estimated: >60 mL/min (ref >60)
Glucose, Bld: 85 mg/dL (ref 70–99)
Potassium: 3.3 mmol/L — ABNORMAL LOW (ref 3.5–5.1)
Sodium: 139 mmol/L (ref 135–145)

## 2020-10-28 LAB — CBC
HCT: 43.3 % (ref 36.0–46.0)
Hemoglobin: 13.8 g/dL (ref 12.0–15.0)
MCH: 32.5 pg (ref 26.0–34.0)
MCHC: 31.9 g/dL (ref 30.0–36.0)
MCV: 101.9 fL — ABNORMAL HIGH (ref 80.0–100.0)
Platelets: 280 10*3/uL (ref 150–400)
RBC: 4.25 MIL/uL (ref 3.87–5.11)
RDW: 12.5 % (ref 11.5–15.5)
WBC: 5.4 10*3/uL (ref 4.0–10.5)
nRBC: 0 % (ref 0.0–0.2)

## 2020-10-28 LAB — SARS CORONAVIRUS 2 BY RT PCR (HOSPITAL ORDER, PERFORMED IN ~~LOC~~ HOSPITAL LAB): SARS Coronavirus 2: NEGATIVE

## 2020-10-28 SURGERY — DILATION AND EVACUATION, UTERUS
Anesthesia: General | Site: Vagina

## 2020-10-28 MED ORDER — BUPIVACAINE-EPINEPHRINE 0.5% -1:200000 IJ SOLN
INTRAMUSCULAR | Status: DC | PRN
  Administered 2020-10-28: 10 mL

## 2020-10-28 MED ORDER — PROPOFOL 500 MG/50ML IV EMUL
INTRAVENOUS | Status: DC | PRN
  Administered 2020-10-28: 100 ug/kg/min via INTRAVENOUS

## 2020-10-28 MED ORDER — LIDOCAINE 2% (20 MG/ML) 5 ML SYRINGE
INTRAMUSCULAR | Status: DC | PRN
  Administered 2020-10-28: 80 mg via INTRAVENOUS

## 2020-10-28 MED ORDER — PROPOFOL 10 MG/ML IV BOLUS
INTRAVENOUS | Status: AC
  Filled 2020-10-28: qty 20

## 2020-10-28 MED ORDER — 0.9 % SODIUM CHLORIDE (POUR BTL) OPTIME
TOPICAL | Status: DC | PRN
  Administered 2020-10-28: 1000 mL

## 2020-10-28 MED ORDER — LACTATED RINGERS IV SOLN: INTRAVENOUS | Status: DC | PRN

## 2020-10-28 MED ORDER — SILVER NITRATE-POT NITRATE 75-25 % EX MISC
CUTANEOUS | Status: DC | PRN
  Administered 2020-10-28: 1

## 2020-10-28 MED ORDER — ACETAMINOPHEN 500 MG PO TABS
1000.0000 mg | ORAL_TABLET | Freq: Once | ORAL | Status: AC
Start: 2020-10-28 — End: 2020-10-28
  Administered 2020-10-28: 1000 mg via ORAL
  Filled 2020-10-28: qty 2

## 2020-10-28 MED ORDER — POVIDONE-IODINE 10 % EX SWAB: 2.0000 "application " | Freq: Once | CUTANEOUS | Status: DC

## 2020-10-28 MED ORDER — FENTANYL CITRATE (PF) 100 MCG/2ML IJ SOLN: 25.0000 ug | INTRAMUSCULAR | Status: DC | PRN

## 2020-10-28 MED ORDER — SILVER NITRATE-POT NITRATE 75-25 % EX MISC
CUTANEOUS | Status: AC
  Filled 2020-10-28: qty 10

## 2020-10-28 MED ORDER — DOXYCYCLINE HYCLATE 100 MG IV SOLR
200.0000 mg | Freq: Once | INTRAVENOUS | Status: AC
  Administered 2020-10-28: 200 mg via INTRAVENOUS
  Filled 2020-10-28: qty 200

## 2020-10-28 MED ORDER — FENTANYL CITRATE (PF) 250 MCG/5ML IJ SOLN
INTRAMUSCULAR | Status: DC | PRN
  Administered 2020-10-28: 100 ug via INTRAVENOUS
  Administered 2020-10-28: 50 ug via INTRAVENOUS

## 2020-10-28 MED ORDER — FENTANYL CITRATE (PF) 250 MCG/5ML IJ SOLN
INTRAMUSCULAR | Status: AC
  Filled 2020-10-28: qty 10

## 2020-10-28 MED ORDER — BUPIVACAINE-EPINEPHRINE 0.5% -1:200000 IJ SOLN
INTRAMUSCULAR | Status: AC
  Filled 2020-10-28: qty 1

## 2020-10-28 SURGICAL SUPPLY — 19 items
CATH ROBINSON RED A/P 16FR (CATHETERS) ×2 IMPLANT
DECANTER SPIKE VIAL GLASS SM (MISCELLANEOUS) ×2 IMPLANT
FILTER UTR ASPR ASSEMBLY (MISCELLANEOUS) ×2 IMPLANT
GLOVE SURG UNDER POLY LF SZ7 (GLOVE) ×4 IMPLANT
GOWN STRL REUS W/ TWL LRG LVL3 (GOWN DISPOSABLE) ×2 IMPLANT
GOWN STRL REUS W/TWL LRG LVL3 (GOWN DISPOSABLE) ×2
HOSE CONNECTING 18IN BERKELEY (TUBING) ×2 IMPLANT
KIT BERKELEY 1ST TRI 3/8 NO TR (MISCELLANEOUS) ×2 IMPLANT
KIT BERKELEY 1ST TRIMESTER 3/8 (MISCELLANEOUS) ×2 IMPLANT
NS IRRIG 1000ML POUR BTL (IV SOLUTION) ×2 IMPLANT
PACK VAGINAL MINOR WOMEN LF (CUSTOM PROCEDURE TRAY) ×2 IMPLANT
PAD OB MATERNITY 4.3X12.25 (PERSONAL CARE ITEMS) ×2 IMPLANT
SET BERKELEY SUCTION TUBING (SUCTIONS) ×2 IMPLANT
TOWEL GREEN STERILE FF (TOWEL DISPOSABLE) ×4 IMPLANT
UNDERPAD 30X36 HEAVY ABSORB (UNDERPADS AND DIAPERS) ×2 IMPLANT
VACURETTE 10 RIGID CVD (CANNULA) IMPLANT
VACURETTE 7MM CVD STRL WRAP (CANNULA) ×2 IMPLANT
VACURETTE 8 RIGID CVD (CANNULA) IMPLANT
VACURETTE 9 RIGID CVD (CANNULA) IMPLANT

## 2020-10-28 NOTE — Anesthesia Preprocedure Evaluation (Addendum)
Anesthesia Evaluation  Patient identified by MRN, date of birth, ID band Patient awake    Reviewed: Allergy & Precautions, H&P , NPO status , Patient's Chart, lab work & pertinent test results  Airway Mallampati: II  TM Distance: >3 FB Neck ROM: Full    Dental no notable dental hx. (+) Teeth Intact, Dental Advisory Given   Pulmonary neg pulmonary ROS,    Pulmonary exam normal breath sounds clear to auscultation       Cardiovascular negative cardio ROS   Rhythm:Regular Rate:Normal     Neuro/Psych Anxiety negative neurological ROS     GI/Hepatic negative GI ROS, Neg liver ROS,   Endo/Other  negative endocrine ROS  Renal/GU negative Renal ROS  negative genitourinary   Musculoskeletal   Abdominal   Peds  Hematology negative hematology ROS (+)   Anesthesia Other Findings   Reproductive/Obstetrics negative OB ROS                            Anesthesia Physical Anesthesia Plan  ASA: II  Anesthesia Plan: MAC   Post-op Pain Management:    Induction: Intravenous  PONV Risk Score and Plan: 4 or greater and Ondansetron, Midazolam and Propofol infusion  Airway Management Planned: Simple Face Mask  Additional Equipment:   Intra-op Plan:   Post-operative Plan:   Informed Consent: I have reviewed the patients History and Physical, chart, labs and discussed the procedure including the risks, benefits and alternatives for the proposed anesthesia with the patient or authorized representative who has indicated his/her understanding and acceptance.     Dental advisory given  Plan Discussed with: CRNA  Anesthesia Plan Comments:        Anesthesia Quick Evaluation

## 2020-10-28 NOTE — Discharge Instructions (Signed)
Carrie Coffey, 1. Nothing in vagina x 2 weeks, no intercourse, no tampons, no douching for two weeks.   2. Expect some vaginal bleeding for next several days, call me if with excessive bleeding requiring you to change a pad every hour.  3.  Expect some abdominal cramping, take pain medication as needed.  Call me if pain is intolerable despite medication use. 4. Please call office to make an appointment to see me in 2 weeks for post procedure check if you do not yet have a follow up appointment.   Sincerely, Dr. Alesia Richards.  Office phone: [phone] Extension: [phone].   Monitored Anesthesia Care, Care After This sheet gives you information about how to care for yourself after your procedure. Your health care provider may also give you more specific instructions. If you have problems or questions, contact your health care provider. What can I expect after the procedure? After the procedure, it is common to have:  Tiredness.  Forgetfulness about what happened after the procedure.  Impaired judgment for important decisions.  Nausea or vomiting.  Some difficulty with balance. Follow these instructions at home: For the time period you were told by your health care provider:  Rest as needed.  Do not participate in activities where you could fall or become injured.  Do not drive or use machinery.  Do not drink alcohol.  Do not take sleeping pills or medicines that cause drowsiness.  Do not make important decisions or sign legal documents.  Do not take care of children on your own.      Eating and drinking  Follow the diet that is recommended by your health care provider.  Drink enough fluid to keep your urine pale yellow.  If you vomit: ? Drink water, juice, or soup when you can drink without vomiting. ? Make sure you have little or no nausea before eating solid foods. General instructions  Have a responsible adult stay with you for the time you are told. It is important to have  someone help care for you until you are awake and alert.  Take over-the-counter and prescription medicines only as told by your health care provider.  If you have sleep apnea, surgery and certain medicines can increase your risk for breathing problems. Follow instructions from your health care provider about wearing your sleep device: ? Anytime you are sleeping, including during daytime naps. ? While taking prescription pain medicines, sleeping medicines, or medicines that make you drowsy.  Avoid smoking.  Keep all follow-up visits as told by your health care provider. This is important. Contact a health care provider if:  You keep feeling nauseous or you keep vomiting.  You feel light-headed.  You are still sleepy or having trouble with balance after 24 hours.  You develop a rash.  You have a fever.  You have redness or swelling around the IV site. Get help right away if:  You have trouble breathing.  You have new-onset confusion at home. Summary  For several hours after your procedure, you may feel tired. You may also be forgetful and have poor judgment.  Have a responsible adult stay with you for the time you are told. It is important to have someone help care for you until you are awake and alert.  Rest as told. Do not drive or operate machinery. Do not drink alcohol or take sleeping pills.  Get help right away if you have trouble breathing, or if you suddenly become confused. This information is not intended to replace  advice given to you by your health care provider. Make sure you discuss any questions you have with your health care provider. Document Revised: 03/10/2020 Document Reviewed: 05/28/2019 Elsevier Patient Education  2021 [employer].  .

## 2020-10-28 NOTE — Anesthesia Postprocedure Evaluation (Signed)
Anesthesia Post Note  Patient: Carrie Coffey  Procedure(s) Performed: DILATATION AND EVACUATION (N/A Vagina )     Patient location during evaluation: PACU Anesthesia Type: MAC Level of consciousness: awake and alert Pain management: pain level controlled Vital Signs Assessment: post-procedure vital signs reviewed and stable Respiratory status: spontaneous breathing, nonlabored ventilation, respiratory function stable and patient connected to nasal cannula oxygen Cardiovascular status: stable and blood pressure returned to baseline Postop Assessment: no apparent nausea or vomiting Anesthetic complications: no   No complications documented.  Last Vitals:  Vitals:   10/28/20 1845 10/28/20 1900  BP: 116/74 127/71  Pulse: 93 91  Resp: 13 16  Temp:  (!) 36.3 C  SpO2: 98% 99%    Last Pain:  Vitals:   10/28/20 1900  TempSrc:   PainSc: 0-No pain                 Belenda Cruise P Giovanni Bath

## 2020-10-28 NOTE — H&P (Addendum)
Carrie Coffey is an 35 y.o. female [phone] with a missed abortion at 6 weeks 1 days EGA here for suction dilation and curettage (D & C). Patient had prior suction D & C on Feb 4th 2022 and found out she was pregnant again a few weeks later without having missed a period.  She had serial ultrasounds in the office that initially showed a viable intrauterine pregnancy with a heart beat  but most recent ultrasound showed no fetal heart beat present.  She desired to proceed with the suction D &C for management of the missed abortion.    Pertinent Gynecological History: Menses: regular every month without intermenstrual spotting Bleeding: None currently.  Contraception: none DES exposure: unknown Blood transfusions: none Sexually transmitted diseases: no past history Previous GYN Procedures: Suction Dilation and Curettage.   Last mammogram: N/A Last pap: normal Date: 08/2020   Menstrual History: No LMP recorded.    Past Medical History:  Diagnosis Date  . Anxiety   . BV (bacterial vaginosis)   . H/O joint problems   . Infection    UTI  . Psoriasis     Past Surgical History:  Procedure Laterality Date  . DILATION AND CURETTAGE OF UTERUS    . DILATION AND EVACUATION N/A 08/12/2020   Procedure: DILATATION AND EVACUATION;  Surgeon: Everett Graff, MD;  Location: [facility];  Service: Gynecology;  Laterality: N/A;  . LAPAROSCOPY N/A 09/10/2013   Procedure: LAPAROSCOPY OPERATIVE;  Surgeon: Annalee Genta, DO;  Location: [facility];  Service: Gynecology;  Laterality: N/A;  . UNILATERAL SALPINGECTOMY Left 09/10/2013   Procedure: UNILATERAL SALPINGECTOMY;  Surgeon: Annalee Genta, DO;  Location: [facility];  Service: Gynecology;  Laterality: Left;     Past OB History:  2005--TAB 6 weeks 2009--SAB 6 weeks 2011--TAB 6 weeks 2014--SAB 6 weeks, s/p suction D & C  2015--Ectopic, left salpingectomy, 9 4/7 weeks 2016--SVB, 40 5/7 weeks, 7+14, female 2017--SVB, 40 3/7 weeks, 9+13, female 2020--SVB, 39 5/7  weeks, female 08/12/2020--SAB at 7 weeks, s/p suction D & C    Family History  Problem Relation Age of Onset  . Asthma Neg Hx   . Cancer Neg Hx   . Diabetes Neg Hx   . Hearing loss Neg Hx   . Heart disease Neg Hx   . Hypertension Neg Hx   . Stroke Neg Hx     Social History:  reports that she has never smoked. She has never used smokeless tobacco. She reports previous alcohol use of about 1.0 standard drink of alcohol per week. She reports that she does not use drugs.  Allergies:  Allergies  Allergen Reactions  . Apple Hives and Swelling  . Food Hives, Swelling and Other (See Comments)    Pt states that she is allergic to strawberries.    . Pear Hives and Swelling  . Allegra Allergy [Fexofenadine Hcl] Hives  . Pseudoephedrine Hives   Current Outpatient Medications  Medication Instructions  . aspirin EC 81 mg, Oral, Daily, Swallow whole.  . ibuprofen (ADVIL) 600 mg, Oral, Every 6 hours PRN  . oxyCODONE-acetaminophen (PERCOCET) 5-325 MG tablet 1 tablet, Oral, Every 6 hours PRN  . Prenatal Vit-Fe Fumarate-FA (PRENATAL VITAMIN) 27-0.8 MG TABS 1 tablet  . progesterone (PROMETRIUM) 400 mg, Oral, 2 times daily  . Vitamin D-3 5,000 mg, Oral, Daily    No medications prior to admission.    Review of Systems Constitutional: Denies fevers/chills Cardiovascular: Denies chest pain or palpitations Pulmonary: Denies coughing or wheezing Gastrointestinal:  Denies nausea, vomiting or diarrhea Genitourinary: Denies pelvic pain, unusual vaginal bleeding, unusual vaginal discharge, dysuria, urgency or frequency.  Musculoskeletal: Denies muscle or joint aches and pain.  Neurology: Denies abnormal sensations such as tingling or numbness.   Physical Exam   Blood pressure 117/81, pulse 98, temperature 98.6 F (37 C), temperature source Oral, resp. rate 18, height 5\' 1"  (1.549 m), weight 79.4 kg, SpO2 100 %, unknown if currently breastfeeding. Constitutional: She is oriented to person, place,  and time. She appears well-developed and well-nourished.  HENT:  Head: Normocephalic and atraumatic.  Neck: Normal range of motion.  Cardiovascular: Normal rate, regular rhythm and normal heart sounds.   Respiratory: Effort normal and breath sounds normal.  GI: Soft. Bowel sounds are normal.  Neurological: She is alert and oriented to person, place, and time.  Skin: Skin is warm and dry.  Psychiatric: She has a normal mood and affect. Her behavior is normal.   Recent Results (from the past 2160 hour(s))  SARS CORONAVIRUS 2 (TAT 6-24 HRS) Nasopharyngeal Nasopharyngeal Swab     Status: None   Collection Time: 08/11/20  3:29 PM   Specimen: Nasopharyngeal Swab  Result Value Ref Range   SARS Coronavirus 2 NEGATIVE NEGATIVE    Comment: (NOTE) SARS-CoV-2 target nucleic acids are NOT DETECTED.  The SARS-CoV-2 RNA is generally detectable in upper and lower respiratory specimens during the acute phase of infection. Negative results do not preclude SARS-CoV-2 infection, do not rule out co-infections with other pathogens, and should not be used as the sole basis for treatment or other patient management decisions. Negative results must be combined with clinical observations, patient history, and epidemiological information. The expected result is Negative.  Fact Sheet for Patients: SugarRoll.be  Fact Sheet for Healthcare Providers: https://www.woods-mathews.com/  This test is not yet approved or cleared by the [place] FDA and  has been authorized for detection and/or diagnosis of SARS-CoV-2 by FDA under an Emergency Use Authorization (EUA). This EUA will remain  in effect (meaning this test can be used) for the duration of the COVID-19 declaration under Se ction 564(b)(1) of the Act, 21 U.S.C. section 360bbb-3(b)(1), unless the authorization is terminated or revoked sooner.  Performed at [facility], [place] [address], [place],  [place] [address]   CBC     Status: None   Collection Time: 08/12/20  2:05 PM  Result Value Ref Range   WBC 5.7 4.0 - 10.5 K/uL   RBC 3.89 3.87 - 5.11 MIL/uL   Hemoglobin 12.9 12.0 - 15.0 g/dL   HCT 38.4 36.0 - 46.0 %   MCV 98.7 80.0 - 100.0 fL   MCH 33.2 26.0 - 34.0 pg   MCHC 33.6 30.0 - 36.0 g/dL   RDW 12.9 11.5 - 15.5 %   Platelets 310 150 - 400 K/uL   nRBC 0.0 0.0 - 0.2 %    Comment: Performed at [facility], [place] [address], [place], [place] [address]  Type and screen     Status: None   Collection Time: 08/12/20  2:05 PM  Result Value Ref Range   ABO/RH(D) O POS    Antibody Screen NEG    Sample Expiration      08/15/2020,2359 Performed at [facility], [place] [address], [place], [place] [address]   Surgical pathology     Status: None   Collection Time: 08/12/20  3:12 PM  Result Value Ref Range   SURGICAL PATHOLOGY  SURGICAL PATHOLOGY CASE: [id number] PATIENT: Najmo Provencio Surgical Pathology Report     Clinical History: Miscarriage 9 weeks (nt)     FINAL MICROSCOPIC DIAGNOSIS:  A. PRODUCTS OF CONCEPTION: - Chorionic villi consistent with products of conception.   GROSS DESCRIPTION:  The specimen is received fresh in a vacuum container and consists of a 6.0 x 4.2 x 1.2 cm aggregate of tan-red soft tissue and clotted blood. Possible villous tissue is grossly identified.  Representative sections are submitted in 1 cassette.  Craig Staggers 08/13/2020)    Final Diagnosis performed by Claudette Laws, MD.   Electronically signed 08/15/2020 Technical component performed at [facility]. [facility], [place] [address] [place], [place] [address].  Professional component performed at [facility], [place] [address] [place], [place] [address].  Immunohistochemistry Technical component (if applicable) was performed at [facility]. [address] [place], [place], [place] [address].   IMMUNOHISTOCHEMISTRY DISCLAIMER (if  applicable): Some of these immunohistochemical stains may have been developed and the performance characteristics determine by [facility]. Some may not have been cleared or approved by the U.S. Food and Drug Administration. The FDA has determined that such clearance or approval is not necessary. This test is used for clinical purposes. It should not be regarded as investigational or for research. This laboratory is certified under the [place] (CLIA-88) as qualified to perform high complexity clinical laboratory testing.  The controls stained appropriately.   SARS Coronavirus 2 by RT PCR (hospital order, performed in [facility] hospital lab) Nasopharyngeal Nasopharyngeal Swab     Status: None   Collection Time: 10/28/20  1:19 PM   Specimen: Nasopharyngeal Swab  Result Value Ref Range   SARS Coronavirus 2 NEGATIVE NEGATIVE    Comment: (NOTE) SARS-CoV-2 target nucleic acids are NOT DETECTED.  The SARS-CoV-2 RNA is generally detectable in upper and lower respiratory specimens during the acute phase of infection. The lowest concentration of SARS-CoV-2 viral copies this assay can detect is 250 copies / mL. A negative result does not preclude SARS-CoV-2 infection and should not be used as the sole basis for treatment or other patient management decisions.  A negative result may occur with improper specimen collection / handling, submission of specimen other than nasopharyngeal swab, presence of viral mutation(s) within the areas targeted by this assay, and inadequate number of viral copies (<250 copies / mL). A negative result must be combined with clinical observations, patient history, and epidemiological information.  Fact Sheet for Patients:   StrictlyIdeas.no  Fact Sheet for Healthcare Providers: BankingDealers.co.za  This test is not yet approved or  cleared by the [place]  FDA and has been authorized for detection and/or diagnosis of SARS-CoV-2 by FDA under an Emergency Use Authorization (EUA).  This EUA will remain in effect (meaning this test can be used) for the duration of the COVID-19 declaration under Section 564(b)(1) of the Act, 21 U.S.C. section 360bbb-3(b)(1), unless the authorization is terminated or revoked sooner.  Performed at [facility], [place] [address] [place] [address]   CBC     Status: Abnormal   Collection Time: 10/28/20  2:00 PM  Result Value Ref Range   WBC 5.4 4.0 - 10.5 K/uL   RBC 4.25 3.87 - 5.11 MIL/uL   Hemoglobin 13.8 12.0 - 15.0 g/dL   HCT 43.3 36.0 - 46.0 %   MCV 101.9 (H) 80.0 - 100.0 fL   MCH 32.5 26.0 - 34.0  pg   MCHC 31.9 30.0 - 36.0 g/dL   RDW 12.5 11.5 - 15.5 %   Platelets 280 150 - 400 K/uL   nRBC 0.0 0.0 - 0.2 %    Comment: Performed at [facility], [place] [address] [place], [place] [id number]  Basic metabolic panel     Status: Abnormal   Collection Time: 10/28/20  2:00 PM  Result Value Ref Range   Sodium 139 135 - 145 mmol/L   Potassium 3.3 (L) 3.5 - 5.1 mmol/L   Chloride 110 98 - 111 mmol/L   CO2 20 (L) 22 - 32 mmol/L   Glucose, Bld 85 70 - 99 mg/dL    Comment: Glucose reference range applies only to samples taken after fasting for at least 8 hours.   BUN <5 (L) 6 - 20 mg/dL   Creatinine, Ser 0.83 0.44 - 1.00 mg/dL   Calcium 9.2 8.9 - 10.3 mg/dL   GFR, Estimated >60 >60 mL/min    Comment: (NOTE) Calculated using the CKD-EPI Creatinine Equation (2021)    Anion gap 9 5 - 15    Comment: Performed at [facility] [address], [place], [place] [address]   Pelvic Ultrasound 09/27/2020: Intrauterine gestational sac measuring 5 weeks 1 day with a yolk sac.   Pelvic Ultrasound 10/10/2020: Two yolk sacs seen on ultrasound.  One fetal pole within sac measures 6 weeks 1 day with a fetal heart beat of 113.  1.3 cm subchorionic hemorrhage.   Pelvic Ultrasound 10/26/2020: Single  Intrauterine pregnancy at 6 weeks 1 day and no fetal heart beat detected.  Normal left and right ovary.    Assessment/Plan:  35 y/o G10 Para 3063 at with a missed abortion at 6 weeks 1 day EGA here for a suction dilation and curettage.  - Patient has had 2 suction D & Cs before and this will be her third one.  We discussed risks, benefits and  alternatives to the procedure including but not limited to heavy bleeding, infection, damage to organs such as uterus, bowel and bladder and possibly requring additional procedures because of this and scarring of the uterus that could negatively impact fertility (Asherman's syndrome). We discussed benefits of quicker resolution of  missed abortion.  She understands that she has other alternatives for management that includes expectant management as well as medical options such as use of misoprostol.  She expressed understand of all this and desired to proceed with the suction D & C.  She also desires fetal chromosome analysis and specimen will be collected and sent.  Archie Endo 10/28/2020, 10:55 AM

## 2020-10-28 NOTE — Transfer of Care (Signed)
Immediate Anesthesia Transfer of Care Note  Patient: Carrie Coffey  Procedure(s) Performed: DILATATION AND EVACUATION (N/A Vagina )  Patient Location: PACU  Anesthesia Type:MAC  Level of Consciousness: awake, alert  and oriented  Airway & Oxygen Therapy: Patient Spontanous Breathing  Post-op Assessment: Report given to RN and Post -op Vital signs reviewed and stable  Post vital signs: Reviewed  Last Vitals:  Vitals Value Taken Time  BP 114/59 10/28/20 1815  Temp 36.1 C 10/28/20 1815  Pulse 91 10/28/20 1823  Resp 24 10/28/20 1823  SpO2 100 % 10/28/20 1823  Vitals shown include unvalidated device data.  Last Pain:  Vitals:   10/28/20 1338  TempSrc: Oral         Complications: No complications documented.

## 2020-10-28 NOTE — Op Note (Signed)
Patient: Carrie Coffey MRN: [id number] Date of birth: 20-Oct-1985  Date of procedure: 10/28/2020  Preop diagnosis:  1. Missed abortion at 6 weeks 1 days EGA. 2. Recurrent miscarriage.   Postop diagnosis: Same as above.    Procedure:  1. Suction dilation and curettage  Surgeon: Dr. Waymon Amato.  Assistant: None.  Anesthesia: Monitored anesthesia care and paracervical block.   Complications: None  Input:  See  Anesthesia flow sheet.   Output: EBL 30cc  including products of conception.    Urine: straight catheterization, 100cc.   Findings: Uterus 8 week sized. No palpable adnexal masses bilaterally. Cervix closed with small dark brown blood coming through the os.  Indications: 35 year-old [id number] with a missed abortion at 6 weeks 1 day EGA here for a suction dilation and curretage.  A recent pelvic ultrasound showed a fetal pole without a fetal heart beat at 6 weeks 1 day EGA though an earlier ultrasound had detected a fetal heart beat.  She was consented for the procedure after explaining the risks, benefits and alternatives of the procedure including risks of bleeding, infection, damage to organs, uterine scarring and Asherman's syndrome.  She was a known blood group O pos.      Procedure: She was taken to the operating room anesthesia was administered without difficulty. Doxycyline IV was given preoperatively.  She was placed in the dorsal lithotomy position and prepped and draped in the usual sterile fashion.   Straight catheterization was performed.  Graves speculum was placed in and cervix grasped with tenaculum anteriorly.  Marcaine with epinephrine was given as a paracervical block, total 10 cc.  Cervix was dilated to the # 21 pratt.  # 7 suction currett advanced through cervix to fundus and electric suction initiated to green zone.  Three passes were made until there was no more products of conception coming out.  Gentle sharp currettage was done and uterine walls noted to  be gritty. Suction was done two more times to clear the uterus of any remaining tissue.  Silver nitrate sticks were used for hemostasis at tenaculum site.  Excellent hemostasis was noted at this point.  The instruments were then removed and the patient was then awoken from anesthesia, she was cleaned and taken to recovery room in stable condition.  Specimen: Products of conception to pathology and genetic testing of fetus.   Dr. Alesia Richards, MD.   10/28/2020.

## 2020-10-28 NOTE — Progress Notes (Signed)
Discharge criteria met. Patient is awake and alert, vital signs stable. Pain level is reported to be at tolerable level per patient. Discharge instructions printed and discussed with patient and patietns family at bedside. Copy of instructions provided to patient. Patient assisted with dressing, peripheral IV removed. Patient escorted to lobby by RN via wheelchair, patients family to drive patient home.

## 2020-10-29 ENCOUNTER — Encounter (HOSPITAL_COMMUNITY): Payer: Self-pay | Admitting: Obstetrics & Gynecology

## 2020-11-01 LAB — SURGICAL PATHOLOGY

## 2021-01-06 LAB — OB RESULTS CONSOLE HEPATITIS B SURFACE ANTIGEN: Hepatitis B Surface Ag: NEGATIVE

## 2021-01-06 LAB — OB RESULTS CONSOLE ABO/RH: RH Type: POSITIVE

## 2021-01-06 LAB — OB RESULTS CONSOLE HIV ANTIBODY (ROUTINE TESTING): HIV: NONREACTIVE

## 2021-01-06 LAB — OB RESULTS CONSOLE GC/CHLAMYDIA
Chlamydia: NEGATIVE
Gonorrhea: NEGATIVE

## 2021-01-06 LAB — OB RESULTS CONSOLE ANTIBODY SCREEN: Antibody Screen: NEGATIVE

## 2021-01-06 LAB — OB RESULTS CONSOLE RPR: RPR: NONREACTIVE

## 2021-01-06 LAB — OB RESULTS CONSOLE RUBELLA ANTIBODY, IGM: Rubella: IMMUNE

## 2021-07-05 ENCOUNTER — Encounter: Payer: Self-pay | Admitting: Registered"

## 2021-07-05 ENCOUNTER — Encounter: Payer: BC Managed Care – PPO | Attending: Obstetrics and Gynecology | Admitting: Registered"

## 2021-07-05 ENCOUNTER — Other Ambulatory Visit: Payer: Self-pay

## 2021-07-05 DIAGNOSIS — O24419 Gestational diabetes mellitus in pregnancy, unspecified control: Secondary | ICD-10-CM | POA: Diagnosis present

## 2021-07-05 NOTE — Progress Notes (Signed)
Patient was seen on 12/28/222 for Gestational Diabetes self-management class at [facility]. The following learning objectives were met by the patient during this course:  States the definition of Gestational Diabetes States why dietary management is important in controlling blood glucose Describes the effects each nutrient has on blood glucose levels Demonstrates ability to create a balanced meal plan Demonstrates carbohydrate counting  States when to check blood glucose levels Demonstrates proper blood glucose monitoring techniques States the effect of stress and exercise on blood glucose levels States the importance of limiting caffeine and abstaining from alcohol and smoking  Blood glucose monitor given: Accu-chek Guide Me Lot #[id number] Exp: 08/18/2022 CBG: 114 mg/dL  Patient instructed to monitor glucose levels: FBS: 60 - <95; 1 hour: <140; 2 hour: <120  Patient received handouts: Nutrition Diabetes and Pregnancy, including carb counting list  Patient will be seen for follow-up as needed.

## 2021-07-09 NOTE — L&D Delivery Note (Signed)
Delivery Note Labor onset:   Labor Onset Time: 0940 Complete dilation at 11:23 AM  Onset of pushing at 1127 FHR second stage Cat 2 Analgesia/Anesthesia intrapartum: epidural  Guided pushing with maternal urge. Delivery of a viable female at 1129. Fetal head delivered in LOA position.  Nuchal cord: N/A.  Infant placed on maternal abd, dried, and tactile stim. Spontaneous cry. Cord double clamped after 1 minute and cut by FOB.  3 Rns present for birth.  Cord blood sample collected: yes Arterial cord blood sample collected: N/A AMTSL Placenta delivered Delena Bali via [place], intact, with 3 VC.  Placenta to L&D. Uterine tone firm U/2, bleeding moderate  No laceration identified.  Anesthesia: N/A Repair: N/A QBL/EBL (mL): 902 Complications: N/A APGAR: APGAR (1 MIN): 9   APGAR (5 MINS): 9   APGAR (10 MINS):   Mom to postpartum.  Baby to Couplet care / Skin to Skin.  Domino MSN, CNM 08/20/2021, 11:45 AM

## 2021-07-28 LAB — OB RESULTS CONSOLE GBS: GBS: NEGATIVE

## 2021-08-07 ENCOUNTER — Telehealth (HOSPITAL_COMMUNITY): Payer: Self-pay | Admitting: *Deleted

## 2021-08-07 ENCOUNTER — Other Ambulatory Visit: Payer: Self-pay | Admitting: Obstetrics and Gynecology

## 2021-08-07 ENCOUNTER — Encounter (HOSPITAL_COMMUNITY): Payer: Self-pay | Admitting: *Deleted

## 2021-08-07 NOTE — Telephone Encounter (Signed)
Preadmission screen  

## 2021-08-08 ENCOUNTER — Telehealth (HOSPITAL_COMMUNITY): Payer: Self-pay | Admitting: *Deleted

## 2021-08-08 ENCOUNTER — Encounter (HOSPITAL_COMMUNITY): Payer: Self-pay | Admitting: *Deleted

## 2021-08-08 NOTE — Telephone Encounter (Signed)
Preadmission screen  

## 2021-08-18 MED ORDER — OXYTOCIN-SODIUM CHLORIDE 30-0.9 UT/500ML-% IV SOLN: 1.0000 m[IU]/min | INTRAVENOUS | Status: DC

## 2021-08-18 NOTE — H&P (Signed)
Carrie Coffey is a 36 y.o. female, [phone], IUP at 61 weeks, presenting for IOL for GDMA1. GBS-. BPP 8/8 2/9. Growth 1/27, vertex, posterior placenta, Afi 20.7, BPP 8/8, 7.7lbs 84%. Pt endorse + Fm. Denies vaginal leakage. Denies vaginal bleeding. Denies feeling cxt's.   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 Recurrent pregnancy loss testing in 12/2020 was significant for trisomy in fetal karyotype.  Had a negative recurrent miscarriage work up.  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.) subchorionic hematoma (x 2 noted at 6 weeks, recheck at [place].) uterine leiomyoma (Pendunculated left fundal noted at anatomy, 2 cm.)  Patient Active Problem List   Diagnosis Date Noted   Gestational diabetes mellitus (GDM) 08/19/2021   Anxiety 08/11/2020   History of multiple miscarriages--x 3 08/11/2020   History of ectopic pregnancy 08/11/2020     Active Ambulatory Problems    Diagnosis Date Noted   Anxiety 08/11/2020   History of multiple miscarriages--x 3 08/11/2020   History of ectopic pregnancy 08/11/2020   Resolved Ambulatory Problems    Diagnosis Date Noted   Ectopic pregnancy 08/01/2013   Vaginal delivery 07/23/2014   Skin tag of vulva  07/23/2014   First degree perineal laceration during delivery 07/25/2014   Labor and delivery indication for care or intervention 02/19/2016   Normal labor 01/31/2019   Normal postpartum course 02/01/2019   Past Medical History:  Diagnosis Date   BV (bacterial vaginosis)    Gestational diabetes    H/O joint problems    Infection    Psoriasis       Medications Prior to Admission  Medication Sig Dispense Refill Last Dose   Cholecalciferol (VITAMIN D-3) 125 MCG (5000 UT) TABS Take 5,000 mg by mouth daily.      ibuprofen (ADVIL) 600 MG tablet Take 1 tablet (600 mg total) by mouth every 6 (six) hours as needed. (Patient not taking: Reported on 10/27/2020) 30 tablet 0    oxyCODONE-acetaminophen (PERCOCET) 5-325 MG tablet Take 1 tablet by mouth every 6 (six) hours as needed for moderate pain or severe pain. (Patient not taking: Reported on 10/27/2020) 10 tablet 0     Past Medical History:  Diagnosis Date   Anxiety    BV (bacterial vaginosis)    Gestational diabetes    H/O joint problems    Infection    UTI   Psoriasis      No current facility-administered medications on file prior to encounter.   Current Outpatient Medications on File Prior to Encounter  Medication Sig Dispense Refill   Cholecalciferol (VITAMIN D-3) 125 MCG (5000 UT) TABS Take 5,000 mg by mouth daily.     ibuprofen (ADVIL) 600 MG tablet Take 1 tablet (600 mg total) by mouth every 6 (six) hours as needed. (Patient not taking: Reported on 10/27/2020) 30  tablet 0   oxyCODONE-acetaminophen (PERCOCET) 5-325 MG tablet Take 1 tablet by mouth every 6 (six) hours as needed for moderate pain or severe pain. (Patient not taking: Reported on 10/27/2020) 10 tablet 0     Allergies  Allergen Reactions   Apple Hives and Swelling   Food Hives, Swelling and Other (See Comments)    Pt states that she is allergic to strawberries.     Pear Hives and Swelling   Allegra Allergy [Fexofenadine Hcl] Hives   Pseudoephedrine Hives     History of present pregnancy: Pt Info/Preference:  Screening/Consents:  Labs:   EDD: Estimated Date of Delivery: 08/26/21  Establised: No LMP recorded. Patient is pregnant.  Anatomy Scan: Date: 22.4 weeks Placenta Location: posterior placenta Genetic Screen: Panoroma:LR AFP: Neg First Tri: Quad:  Office: [facility]             First PNV: 8 weeks Blood Type --/--/O POS (02/11 0829)  Language: english Last PNV: 38.5 weeks Rhogam    Flu Vaccine:  utd   Antibody NEG (02/11 0829)  TDaP vaccine utd   GTT: Early: 5.5 Third Trimester: 166  Feeding Plan: Breast/bottle BTL: no Rubella: Immune (07/01 0000)  Contraception: Husband to get vasectomy PP VBAC: no RPR: Nonreactive (07/01 0000)   Circumcision: ???   HBsAg: Negative (07/01 0000)  Pediatrician:  ???   HIV: Non-reactive (07/01 0000)   Prenatal Classes: no Additional [place]: BPP 8/8 2/9. Growth 1/27, vertex, posterior placenta, Afi 20.7, BPP 8/8, 7.7lbs 84%. GBS: Negative/-- (01/20 0000)(For PCN allergy, check sensitivities)       Chlamydia: neg    MFM Referral/Consult:  GC: neg  Support Person: partner   PAP: ???  Pain Management: Possible natural Neonatologist Referral:  Hgb Electrophoresis:  AA  Birth Plan: [place]   Hgb NOB: 12.7    28W: 11.5   OB History     Gravida  9   Para  3   Term  3   Preterm      AB  5   Living  3      SAB  2   IAB  2   Ectopic  1   Multiple  0   Live Births  3          Past Medical History:  Diagnosis Date   Anxiety    BV (bacterial vaginosis)    Gestational diabetes    H/O joint problems    Infection    UTI   Psoriasis    Past Surgical History:  Procedure Laterality Date   DILATION AND CURETTAGE OF UTERUS     DILATION AND EVACUATION N/A 08/12/2020   Procedure: DILATATION AND EVACUATION;  Surgeon: Everett Graff, MD;  Location: [facility];  Service: Gynecology;  Laterality: N/A;   DILATION AND EVACUATION N/A 10/28/2020   Procedure: DILATATION AND EVACUATION;  Surgeon: Waymon Amato, MD;   Location: [facility];  Service: Gynecology;  Laterality: N/A;   LAPAROSCOPY N/A 09/10/2013   Procedure: LAPAROSCOPY OPERATIVE;  Surgeon: Annalee Genta, DO;  Location: [facility];  Service: Gynecology;  Laterality: N/A;   UNILATERAL SALPINGECTOMY Left 09/10/2013   Procedure: UNILATERAL SALPINGECTOMY;  Surgeon: Annalee Genta, DO;  Location: [facility];  Service: Gynecology;  Laterality: Left;   Family History: family history is not on file. Social History:  reports that she has never smoked. She has never used smokeless tobacco. She reports that she does not currently use alcohol after a past usage of about 1.0 standard drink  per week. She reports that she does not use drugs.   Prenatal Transfer Tool  Maternal Diabetes: Yes:  Diabetes Type:  Diet controlled Genetic Screening: Normal Maternal Ultrasounds/Referrals: Normal Fetal Ultrasounds or other Referrals:  None Maternal Substance Abuse:  No Significant Maternal Medications:  None Significant Maternal Lab Results: Group B Strep negative  ROS:  Review of Systems  Constitutional: Negative.   HENT: Negative.    Eyes: Negative.   Respiratory: Negative.    Cardiovascular: Negative.   Gastrointestinal: Negative.   Genitourinary: Negative.   Musculoskeletal: Negative.   Skin: Negative.   Neurological: Negative.   Endo/Heme/Allergies: Negative.   Psychiatric/Behavioral: Negative.      Physical Exam: BP 133/76    Pulse 100    Resp 16    Ht 5\' 1"  (1.549 m)    Wt 91.7 kg    BMI 38.21 kg/m   Physical Exam Vitals and nursing note reviewed.  Constitutional:      Appearance: Normal appearance.  HENT:     Head: Normocephalic and atraumatic.     Nose: Nose normal.     Mouth/Throat:     Mouth: Mucous membranes are moist.  Eyes:     Pupils: Pupils are equal, round, and reactive to light.  Cardiovascular:     Rate and Rhythm: Normal rate and regular rhythm.     Pulses: Normal pulses.     Heart sounds: Normal heart sounds.  Pulmonary:     Effort:  Pulmonary effort is normal.     Breath sounds: Normal breath sounds.  Abdominal:     General: Bowel sounds are normal.  Genitourinary:    Comments: Uterus gravida, pelvis adequate for vaginal delivery  Musculoskeletal:        General: Normal range of motion.     Cervical back: Normal range of motion and neck supple.  Skin:    General: Skin is warm.     Capillary Refill: Capillary refill takes less than 2 seconds.  Neurological:     General: No focal deficit present.     Mental Status: She is alert.  Psychiatric:        Mood and Affect: Mood normal.     NST: FHR baseline 150 bpm, Variability: moderate, Accelerations:present, Decelerations:  Absent= Cat 1/Reactive UC:   OCC.  SVE:  1/50/-3 Dilation: 1.5 Effacement (%): 50 Station: Ballotable Exam by:: [facility] CNM, vertex verified by fetal sutures.  Leopold's: Position vertex, EFW 8lbs via leopold's.  Pelvis proven to 9.13lbs.  Foley bulb placed with ease, frank blood noted 13mls at time of placement.  Fetal head noted to be high in pelvis, bedside US used showed vertex, but slightly off to maternal left with body going towards right.  Labs: Results for orders placed or performed during the hospital encounter of 08/19/21 (from the past 24 hour(s))  CBC     Status: None   Collection Time: 08/19/21  8:29 AM  Result Value Ref Range   WBC 9.5 4.0 - 10.5 K/uL   RBC 3.92 3.87 - 5.11 MIL/uL   Hemoglobin 12.3 12.0 - 15.0 g/dL   HCT 37.3 36.0 - 46.0 %   MCV 95.2 80.0 - 100.0 fL   MCH 31.4 26.0 - 34.0 pg   MCHC 33.0 30.0 - 36.0 g/dL   RDW 14.6 11.5 - 15.5 %   Platelets 205 150 - 400 K/uL   nRBC 0.0 0.0 - 0.2 %  Type and screen     Status: None   Collection  Time: 08/19/21  8:29 AM  Result Value Ref Range   ABO/RH(D) O POS    Antibody Screen NEG    Sample Expiration      08/22/2021,2359 Performed at [facility], [place] [address], [place], [place] [address]   Resp Panel by RT-PCR (Flu A&B, Covid) Nasopharyngeal Swab      Status: None   Collection Time: 08/19/21  9:00 AM   Specimen: Nasopharyngeal Swab; Nasopharyngeal(NP) swabs in vial transport medium  Result Value Ref Range   SARS Coronavirus 2 by RT PCR NEGATIVE NEGATIVE   Influenza A by PCR NEGATIVE NEGATIVE   Influenza B by PCR NEGATIVE NEGATIVE    Imaging:  No results found.  MAU Course: Orders Placed This Encounter  Procedures   Resp Panel by RT-PCR (Flu A&B, Covid) Nasopharyngeal Swab   CBC   RPR   Diet clear liquid Room service appropriate? Yes; Fluid consistency: Thin   Evaluate fetal heart rate to establish reassuring pattern prior to initiating Cytotec or Pitocin   Perform a cervical exam prior to initiating Cytotec or Pitocin   Discontinue Pitocin if tachysystole with non-reassuring FHR is present   Nofify MD/CNM if tachysystole with non-reassuring FHR is present   Initiate intrauterine resuscitation if tachysystole with non-reasuring FHR is present   If tachysystole WITH reassuring FHR present notify MD / CNM   May administer Terbutaline 0.25 mg SQ x 1 dose if tachysystole with non-reassuring FHR is presesnt   Labor Induction   Evaluate fetal heart rate to establish reassuring pattern prior to initiating Cytotec or Pitocin   Perform a cervical exam prior to initiating Cytotec or Pitocin   Discontinue Pitocin if tachysystole with non-reassuring FHR is present   Nofify MD/CNM if tachysystole with non-reassuring FHR is present   Initiate intrauterine resuscitation if tachysystole with non-reasuring FHR is present   If tachysystole WITH reassuring FHR present notify MD / CNM   May administer Terbutaline 0.25 mg SQ x 1 dose if tachysystole with non-reassuring FHR is presesnt   Labor Induction   Vitals signs per unit policy   Notify physician (specify)   Fetal monitoring per unit policy   Activity as tolerated   Measure blood pressure post delivery every 15 min x 1 hour then every 30 min x 1 hour   Fundal check post delivery every 15  min x 1 hour then every 30 min x 1 hour   If Rapid HIV test positive or known HIV positive: initiate AZT orders   May in and out cath x 2 for inability to void   Refer to Sidebar Report Urinary (Foley) Catheter Indications   Refer to Sidebar Report Post Indwelling Urinary Catheter Removal and Intervention Guidelines   Discontinue foley prior to vaginal delivery   Initiate Carrier Fluid Protocol   Initiate Oral Care Protocol   Patient may have epidural placement upon request   Full code   Type and screen   Insert and maintain IV Line   Admit to Inpatient (patient's expected length of stay will be greater than 2 midnights or inpatient only procedure)   Meds ordered this encounter  Medications   DISCONTD: oxytocin (PITOCIN) IV infusion 30 units in NS 500 mL - Premix    Order Specific Question:   Begin infusion at:    Answer:   2 milli-units/min (2 mL/hr)    Order Specific Question:   Increase infusion by:    Answer:   2 milli-units/min (2 mL/hr)   terbutaline (BRETHINE)  injection 0.25 mg   misoprostol (CYTOTEC) tablet 25 mcg   lactated ringers infusion   oxytocin (PITOCIN) IV BOLUS FROM BAG   oxytocin (PITOCIN) IV infusion 30 units in NS 500 mL - Premix   lactated ringers infusion 500-1,000 mL   acetaminophen (TYLENOL) tablet 650 mg   ondansetron (ZOFRAN) injection 4 mg   sodium citrate-citric acid (ORACIT) solution 30 mL   lidocaine (PF) (XYLOCAINE) 1 % injection 30 mL   misoprostol (CYTOTEC) tablet 50 mcg   oxytocin (PITOCIN) IV infusion 30 units in NS 500 mL - Premix    Order Specific Question:   Begin infusion at:    Answer:   1 milli-unit/min (1 mL/hr)    Order Specific Question:   Increase infusion by:    Answer:   1 milli-unit/min (1 mL/hr)    Assessment/Plan: Carrie Coffey is a 36 y.o. female, [phone], IUP at 58 weeks, presenting for IOL for GDMA1. GBS-. BPP 8/8 2/9. Growth 1/27, vertex, posterior placenta, Afi 20.7, BPP 8/8, 7.7lbs 84%. Pt endorse + Fm. Denies  vaginal leakage. Denies vaginal bleeding. Denies feeling cxt's. Foley bulb placed, bleeding noted, 32mls, plan to reassess. Fetal head high and off to maternal left pelvis.  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 Recurrent pregnancy loss testing in 12/2020 was significant for trisomy in fetal karyotype.  Had a negative recurrent miscarriage work up.  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   Plan: Admit to [facility] Routine [facility] orders Pain med/epidural prn Repeat CBC at 1230 today to check on bleeding Plan to recheck with bedside US fetal presentation  Plan for pitocin 1x1 to max of 6 until foley bulb out.  Monitor vaginal bleeding.  Anticipate labor progression   Noralyn Pick NP-C, CNM, MSN 08/19/2021, 11:18 AM

## 2021-08-19 ENCOUNTER — Other Ambulatory Visit: Payer: Self-pay

## 2021-08-19 ENCOUNTER — Encounter (HOSPITAL_COMMUNITY): Payer: Self-pay | Admitting: Obstetrics and Gynecology

## 2021-08-19 ENCOUNTER — Inpatient Hospital Stay (HOSPITAL_COMMUNITY): Payer: BC Managed Care – PPO | Admitting: Anesthesiology

## 2021-08-19 ENCOUNTER — Inpatient Hospital Stay (HOSPITAL_COMMUNITY)
Admission: RE | Admit: 2021-08-19 | Discharge: 2021-08-21 | DRG: 807 | Disposition: A | Discharge status: Home / Self Care | Payer: BC Managed Care – PPO | Attending: Obstetrics & Gynecology | Admitting: Obstetrics & Gynecology

## 2021-08-19 ENCOUNTER — Inpatient Hospital Stay (HOSPITAL_COMMUNITY): Payer: BC Managed Care – PPO

## 2021-08-19 DIAGNOSIS — D259 Leiomyoma of uterus, unspecified: Secondary | ICD-10-CM | POA: Diagnosis present

## 2021-08-19 DIAGNOSIS — Z3A39 39 weeks gestation of pregnancy: Secondary | ICD-10-CM

## 2021-08-19 DIAGNOSIS — O3483 Maternal care for other abnormalities of pelvic organs, third trimester: Secondary | ICD-10-CM | POA: Diagnosis present

## 2021-08-19 DIAGNOSIS — O24419 Gestational diabetes mellitus in pregnancy, unspecified control: Secondary | ICD-10-CM

## 2021-08-19 DIAGNOSIS — N83201 Unspecified ovarian cyst, right side: Secondary | ICD-10-CM | POA: Diagnosis present

## 2021-08-19 DIAGNOSIS — Z20822 Contact with and (suspected) exposure to covid-19: Secondary | ICD-10-CM | POA: Diagnosis present

## 2021-08-19 DIAGNOSIS — O24420 Gestational diabetes mellitus in childbirth, diet controlled: Secondary | ICD-10-CM | POA: Diagnosis present

## 2021-08-19 DIAGNOSIS — O3413 Maternal care for benign tumor of corpus uteri, third trimester: Secondary | ICD-10-CM | POA: Diagnosis present

## 2021-08-19 LAB — CBC
HCT: 37.3 % (ref 36.0–46.0)
HCT: 37.7 % (ref 36.0–46.0)
Hemoglobin: 12.3 g/dL (ref 12.0–15.0)
Hemoglobin: 12.3 g/dL (ref 12.0–15.0)
MCH: 31.4 pg (ref 26.0–34.0)
MCH: 31.1 pg (ref 26.0–34.0)
MCHC: 33 g/dL (ref 30.0–36.0)
MCHC: 32.6 g/dL (ref 30.0–36.0)
MCV: 95.2 fL (ref 80.0–100.0)
MCV: 95.2 fL (ref 80.0–100.0)
Platelets: 205 10*3/uL (ref 150–400)
Platelets: 212 10*3/uL (ref 150–400)
RBC: 3.92 MIL/uL (ref 3.87–5.11)
RBC: 3.96 MIL/uL (ref 3.87–5.11)
RDW: 14.6 % (ref 11.5–15.5)
RDW: 14.2 % (ref 11.5–15.5)
WBC: 9.5 10*3/uL (ref 4.0–10.5)
WBC: 9.4 10*3/uL (ref 4.0–10.5)
nRBC: 0 % (ref 0.0–0.2)
nRBC: 0 % (ref 0.0–0.2)

## 2021-08-19 LAB — TYPE AND SCREEN
ABO/RH(D): O POS
Antibody Screen: NEGATIVE

## 2021-08-19 LAB — RESP PANEL BY RT-PCR (FLU A&B, COVID) ARPGX2
Influenza A by PCR: NEGATIVE
Influenza B by PCR: NEGATIVE
SARS Coronavirus 2 by RT PCR: NEGATIVE

## 2021-08-19 LAB — GLUCOSE, CAPILLARY
Glucose-Capillary: 104 mg/dL — ABNORMAL HIGH (ref 70–99)
Glucose-Capillary: 150 mg/dL — ABNORMAL HIGH (ref 70–99)
Glucose-Capillary: 120 mg/dL — ABNORMAL HIGH (ref 70–99)
Glucose-Capillary: 101 mg/dL — ABNORMAL HIGH (ref 70–99)

## 2021-08-19 MED ORDER — PHENYLEPHRINE 40 MCG/ML (10ML) SYRINGE FOR IV PUSH (FOR BLOOD PRESSURE SUPPORT)
80.0000 ug | PREFILLED_SYRINGE | INTRAVENOUS | Status: DC | PRN
  Filled 2021-08-19: qty 10

## 2021-08-19 MED ORDER — LACTATED RINGERS IV SOLN
500.0000 mL | Freq: Once | INTRAVENOUS | Status: AC
  Administered 2021-08-20: 500 mL via INTRAVENOUS

## 2021-08-19 MED ORDER — OXYTOCIN BOLUS FROM INFUSION
333.0000 mL | Freq: Once | INTRAVENOUS | Status: AC
  Administered 2021-08-20: 333 mL via INTRAVENOUS

## 2021-08-19 MED ORDER — EPHEDRINE 5 MG/ML INJ: 10.0000 mg | INTRAVENOUS | Status: DC | PRN

## 2021-08-19 MED ORDER — LIDOCAINE HCL (PF) 1 % IJ SOLN
INTRAMUSCULAR | Status: DC | PRN
  Administered 2021-08-19 (×2): 5 mL via EPIDURAL

## 2021-08-19 MED ORDER — OXYTOCIN-SODIUM CHLORIDE 30-0.9 UT/500ML-% IV SOLN
1.0000 m[IU]/min | INTRAVENOUS | Status: DC
  Administered 2021-08-19: 1 m[IU]/min via INTRAVENOUS

## 2021-08-19 MED ORDER — OXYTOCIN-SODIUM CHLORIDE 30-0.9 UT/500ML-% IV SOLN
2.5000 [IU]/h | INTRAVENOUS | Status: DC
  Administered 2021-08-20: 2.5 [IU]/h via INTRAVENOUS
  Filled 2021-08-19 (×2): qty 500

## 2021-08-19 MED ORDER — SOD CITRATE-CITRIC ACID 500-334 MG/5ML PO SOLN: 30.0000 mL | ORAL | Status: DC | PRN

## 2021-08-19 MED ORDER — LACTATED RINGERS IV SOLN: INTRAVENOUS | Status: DC

## 2021-08-19 MED ORDER — LACTATED RINGERS IV SOLN
500.0000 mL | INTRAVENOUS | Status: DC | PRN
  Administered 2021-08-20 (×2): 500 mL via INTRAVENOUS

## 2021-08-19 MED ORDER — ACETAMINOPHEN 325 MG PO TABS: 650.0000 mg | ORAL_TABLET | ORAL | Status: DC | PRN

## 2021-08-19 MED ORDER — FENTANYL-BUPIVACAINE-NACL 0.5-0.125-0.9 MG/250ML-% EP SOLN
12.0000 mL/h | EPIDURAL | Status: DC | PRN
  Administered 2021-08-19: 11 mL/h via EPIDURAL
  Filled 2021-08-19: qty 250

## 2021-08-19 MED ORDER — LIDOCAINE HCL (PF) 1 % IJ SOLN: 30.0000 mL | INTRAMUSCULAR | Status: DC | PRN

## 2021-08-19 MED ORDER — TERBUTALINE SULFATE 1 MG/ML IJ SOLN: 0.2500 mg | Freq: Once | INTRAMUSCULAR | Status: DC | PRN

## 2021-08-19 MED ORDER — OXYTOCIN-SODIUM CHLORIDE 30-0.9 UT/500ML-% IV SOLN: 1.0000 m[IU]/min | INTRAVENOUS | Status: DC

## 2021-08-19 MED ORDER — MISOPROSTOL 50MCG HALF TABLET: 50.0000 ug | ORAL_TABLET | ORAL | Status: DC

## 2021-08-19 MED ORDER — MISOPROSTOL 25 MCG QUARTER TABLET: 25.0000 ug | ORAL_TABLET | ORAL | Status: DC | PRN

## 2021-08-19 MED ORDER — DIPHENHYDRAMINE HCL 50 MG/ML IJ SOLN: 12.5000 mg | INTRAMUSCULAR | Status: DC | PRN

## 2021-08-19 MED ORDER — PHENYLEPHRINE 40 MCG/ML (10ML) SYRINGE FOR IV PUSH (FOR BLOOD PRESSURE SUPPORT): 80.0000 ug | PREFILLED_SYRINGE | INTRAVENOUS | Status: DC | PRN

## 2021-08-19 MED ORDER — ONDANSETRON HCL 4 MG/2ML IJ SOLN: 4.0000 mg | Freq: Four times a day (QID) | INTRAMUSCULAR | Status: DC | PRN

## 2021-08-19 NOTE — Plan of Care (Signed)
°  Problem: Education: Goal: Knowledge of Childbirth will improve Outcome: Progressing Goal: Ability to make informed decisions regarding treatment and plan of care will improve Outcome: Progressing Goal: Ability to state and carry out methods to decrease the pain will improve Outcome: Progressing Goal: Individualized Educational Video(s) Outcome: Progressing   Problem: Coping: Goal: Ability to verbalize concerns and feelings about labor and delivery will improve Outcome: Progressing   Problem: Life Cycle: Goal: Ability to make normal progression through stages of labor will improve Outcome: Progressing Goal: Ability to effectively push during vaginal delivery will improve Outcome: Progressing   Problem: Role Relationship: Goal: Will demonstrate positive interactions with the child Outcome: Progressing   Problem: Safety: Goal: Risk of complications during labor and delivery will decrease Outcome: Progressing   Problem: Pain Management: Goal: Relief or control of pain from uterine contractions will improve Outcome: Progressing   

## 2021-08-19 NOTE — Anesthesia Procedure Notes (Signed)
Epidural Patient location during procedure: OB Start time: 08/19/2021 7:34 PM End time: 08/19/2021 7:42 PM  Staffing Anesthesiologist: Josephine Igo, MD Performed: anesthesiologist   Preanesthetic Checklist Completed: patient identified, IV checked, site marked, risks and benefits discussed, surgical consent, monitors and equipment checked, pre-op evaluation and timeout performed  Epidural Patient position: sitting Prep: DuraPrep and site prepped and draped Patient monitoring: continuous pulse ox and blood pressure Approach: midline Location: L3-L4 Injection technique: LOR air  Needle:  Needle type: Tuohy  Needle gauge: 17 G Needle length: 9 cm and 9 Needle insertion depth: 5 cm Catheter type: closed end flexible Catheter size: 19 Gauge Catheter at skin depth: 10 cm Test dose: negative and Other  Assessment Events: blood not aspirated, injection not painful, no injection resistance, no paresthesia and negative IV test  Additional Notes Patient identified. Risks and benefits discussed including failed block, incomplete  Pain control, post dural puncture headache, nerve damage, paralysis, blood pressure Changes, nausea, vomiting, reactions to medications-both toxic and allergic and post Partum back pain. All questions were answered. Patient expressed understanding and wished to proceed. Sterile technique was used throughout procedure. Epidural site was Dressed with sterile barrier dressing. No paresthesias, signs of intravascular injection Or signs of intrathecal spread were encountered.  Patient was more comfortable after the epidural was dosed. Please see RN's note for documentation of vital signs and FHR which are stable. Reason for block:procedure for pain

## 2021-08-19 NOTE — Progress Notes (Signed)
Labor Progress Note.   Carrie Coffey is a 36 y.o. female, [phone], IUP at 29 weeks, presenting for IOL for GDMA1. GBS-. BPP 8/8 2/9. Growth 1/27, vertex, posterior placenta, Afi 20.7, BPP 8/8, 7.7lbs 84%.   Subjective: Pt feeling cxt and able to breath through them, but pt feeling very uncomfortable and tired. Pt desires her epidural now. Plan to restart pitocin once epidural in place. Bedside US verified vertex.      Patient Active Problem List    Diagnosis Date Noted   Gestational diabetes mellitus (GDM) 08/19/2021   Anxiety 08/11/2020   History of multiple miscarriages--x 3 08/11/2020   History of ectopic pregnancy 08/11/2020    Objective: BP 130/67    Pulse 89    Temp 98.4 F (36.9 C) (Oral)    Resp 16    Ht 5\' 1"  (1.549 m)    Wt 91.7 kg    BMI 38.21 kg/m  No intake/output data recorded. No intake/output data recorded. NST: FHR baseline 145 bpm, Variability: moderate, Accelerations:present, Decelerations:  Absent= Cat 1/Reactive CTX:  regular, every 3-6 minutes Uterus gravid, soft non tender, moderate to palpate with contractions.  SVE:  Dilation: 4 Effacement (%): 50 Station: Ballotable Exam by:: [employer] CNM Pitocin turned off mUn/min Foley bulb out, bloody show.  Bedside US done vertex verified.    Assessment:  Carrie Coffey is a 36 y.o. female, [phone], IUP at 30 weeks, presenting for IOL for GDMA1. GBS-. BPP 8/8 2/9. Growth 1/27, vertex, posterior placenta, Afi 20.7, BPP 8/8, 7.7lbs 84%.   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 Recurrent pregnancy loss testing in 12/2020 was significant for trisomy in fetal karyotype.  Had a negative recurrent miscarriage work up.  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.) subchorionic hematoma (x 2 noted at 6 weeks, recheck at [place].) uterine leiomyoma (Pendunculated left fundal noted at anatomy, 2 cm.)     Patient Active Problem List    Diagnosis Date Noted   Gestational diabetes mellitus (GDM) 08/19/2021   Anxiety 08/11/2020   History of multiple miscarriages--x 3 08/11/2020   History of ectopic pregnancy 08/11/2020    NICHD: Category 1   Membranes:  Intact, no s/s of infection   Asynclitic position maternal left is fetal head, and maternal right is fetal body    Induction:               Cytotec xNO             Foley Bulb: inserted placed at 1000, out at 1400 2/11             Pitocin - Off now, pt will get epidural.    Pain management:               IV pain management: xPRN  Nitrous: PRN             Epidural placement: PRN   GBS Negative   A1GDM: stable sugars CBG (last 3)  Recent Labs (last 2 labs)         Recent Labs    08/19/21 1122 2/11 1517 2/11 1916 08/19/21 1517  GLUCAP 104* 150 120 150*      Plan: Continue labor plan Continuous monitoring Rest Frequent position changes to facilitate fetal rotation and descent. Will reassess with cervical exam at 4 hours or earlier if necessary Pit off for dinner break now.  Bedside US done that verified vertex restarting pitocin per protocol 1x1 post epidural placement.  Anticipate labor progression and vaginal delivery.      Noralyn Pick, NP-C, CNM, MSN 08/19/2021. 4:20  PM

## 2021-08-19 NOTE — Final Progress Note (Signed)
Labor Progress Note  Carrie Coffey is a 36 y.o. female, [phone], IUP at 9 weeks, presenting for IOL for GDMA1. GBS-. BPP 8/8 2/9. Growth 1/27, vertex, posterior placenta, Afi 20.7, BPP 8/8, 7.7lbs 84%.  Subjective: Pt feeling cxt and tolerates, well, pt hungry, foley bulb out with blood show now, pt desires pit break to eat. Pt also contemplating epidural once we restart pitocin.  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 No intake/output data recorded. NST: FHR baseline 145 bpm, Variability: moderate, Accelerations:present, Decelerations:  Absent= Cat 1/Reactive CTX:  regular, every 2-3 minutes Uterus gravid, soft non tender, moderate to palpate with contractions.  SVE:  Dilation: 4 Effacement (%): 50 Station: Ballotable Exam by:: [employer] CNM Pitocin at 6 then turned off mUn/min Foley bulb out, bloody show.   Assessment:  Carrie Coffey is a 36 y.o. female, [phone], IUP at 56 weeks, presenting for IOL for GDMA1. GBS-. BPP 8/8 2/9. Growth 1/27, vertex, posterior placenta, Afi 20.7, BPP 8/8, 7.7lbs 84%.  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 Recurrent pregnancy loss testing in 12/2020 was significant for trisomy in fetal karyotype.  Had a negative recurrent miscarriage work up.  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.) subchorionic hematoma (x 2 noted at 6 weeks, recheck at [place].) uterine leiomyoma (Pendunculated left fundal noted at anatomy, 2 cm.) Patient Active Problem List   Diagnosis Date Noted   Gestational diabetes mellitus (GDM) 08/19/2021   Anxiety 08/11/2020   History of multiple miscarriages--x 3 08/11/2020   History of ectopic pregnancy 08/11/2020   NICHD: Category 1  Membranes:  Intact, no s/s of infection  Asynclitic position maternal left is fetal head, and maternal right is fetal body   Induction:    Cytotec xNO  Foley Bulb: inserted placed at 1000, out at 1400 2/11  Pitocin - 6, off for pit break and dinner.   Pain management:               IV pain management: xPRN  Nitrous: PRN             Epidural placement: PRN  GBS Negative  A1GDM: stable sugars CBG (last 3)  Recent Labs    08/19/21 1122 08/19/21 1517  GLUCAP 104* 150*   Plan: Continue labor plan Continuous/intermittent monitoring Rest Ambulate Frequent position changes to facilitate fetal  rotation and descent. Will reassess with cervical exam at 4 hours or earlier if necessary Pit off for dinner break now.  Bedside US prior to restarting pitocin per protocol 2x2 Anticipate labor progression and vaginal delivery.    Noralyn Pick, NP-C, CNM, MSN 08/19/2021. 4:20 PM

## 2021-08-19 NOTE — Anesthesia Preprocedure Evaluation (Signed)
Anesthesia Evaluation  Patient identified by MRN, date of birth, ID band Patient awake    Reviewed: Allergy & Precautions, Patient's Chart, lab work & pertinent test results  Airway Mallampati: II  TM Distance: >3 FB Neck ROM: Full    Dental no notable dental hx. (+) Teeth Intact   Pulmonary neg pulmonary ROS,    Pulmonary exam normal        Cardiovascular negative cardio ROS Normal cardiovascular exam Rhythm:Regular     Neuro/Psych Anxiety negative neurological ROS     GI/Hepatic Neg liver ROS, GERD  ,  Endo/Other  diabetes, Well Controlled, GestationalObesity  Renal/GU negative Renal ROS  negative genitourinary   Musculoskeletal Hx/o psoriasis   Abdominal (+) + obese,   Peds  Hematology   Anesthesia Other Findings   Reproductive/Obstetrics (+) Pregnancy                             Anesthesia Physical Anesthesia Plan  ASA: 2  Anesthesia Plan: Epidural   Post-op Pain Management:    Induction:   PONV Risk Score and Plan:   Airway Management Planned: Natural Airway  Additional Equipment:   Intra-op Plan:   Post-operative Plan:   Informed Consent: I have reviewed the patients History and Physical, chart, labs and discussed the procedure including the risks, benefits and alternatives for the proposed anesthesia with the patient or authorized representative who has indicated his/her understanding and acceptance.       Plan Discussed with: Anesthesiologist  Anesthesia Plan Comments:         Anesthesia Quick Evaluation

## 2021-08-20 ENCOUNTER — Encounter (HOSPITAL_COMMUNITY): Payer: Self-pay | Admitting: Obstetrics and Gynecology

## 2021-08-20 LAB — GLUCOSE, CAPILLARY
Glucose-Capillary: 121 mg/dL — ABNORMAL HIGH (ref 70–99)
Glucose-Capillary: 113 mg/dL — ABNORMAL HIGH (ref 70–99)

## 2021-08-20 LAB — RPR: RPR Ser Ql: NONREACTIVE

## 2021-08-20 MED ORDER — WITCH HAZEL-GLYCERIN EX PADS: 1.0000 "application " | MEDICATED_PAD | CUTANEOUS | Status: DC | PRN

## 2021-08-20 MED ORDER — SIMETHICONE 80 MG PO CHEW: 80.0000 mg | CHEWABLE_TABLET | ORAL | Status: DC | PRN

## 2021-08-20 MED ORDER — ACETAMINOPHEN 325 MG PO TABS: 650.0000 mg | ORAL_TABLET | ORAL | Status: DC | PRN

## 2021-08-20 MED ORDER — DIPHENHYDRAMINE HCL 25 MG PO CAPS: 25.0000 mg | ORAL_CAPSULE | Freq: Four times a day (QID) | ORAL | Status: DC | PRN

## 2021-08-20 MED ORDER — IBUPROFEN 600 MG PO TABS
600.0000 mg | ORAL_TABLET | Freq: Four times a day (QID) | ORAL | Status: DC
  Administered 2021-08-20 – 2021-08-21 (×4): 600 mg via ORAL
  Filled 2021-08-20 (×4): qty 1

## 2021-08-20 MED ORDER — OXYTOCIN-SODIUM CHLORIDE 30-0.9 UT/500ML-% IV SOLN
1.0000 m[IU]/min | INTRAVENOUS | Status: DC
  Administered 2021-08-20: 10 m[IU]/min via INTRAVENOUS

## 2021-08-20 MED ORDER — BENZOCAINE-MENTHOL 20-0.5 % EX AERO: 1.0000 "application " | INHALATION_SPRAY | CUTANEOUS | Status: DC | PRN

## 2021-08-20 MED ORDER — ONDANSETRON HCL 4 MG/2ML IJ SOLN: 4.0000 mg | INTRAMUSCULAR | Status: DC | PRN

## 2021-08-20 MED ORDER — TRANEXAMIC ACID-NACL 1000-0.7 MG/100ML-% IV SOLN
1000.0000 mg | INTRAVENOUS | Status: AC
  Administered 2021-08-20: 1000 mg via INTRAVENOUS

## 2021-08-20 MED ORDER — DIBUCAINE (PERIANAL) 1 % EX OINT: 1.0000 "application " | TOPICAL_OINTMENT | CUTANEOUS | Status: DC | PRN

## 2021-08-20 MED ORDER — COCONUT OIL OIL
1.0000 | TOPICAL_OIL | Status: DC | PRN
Start: 2021-08-20 — End: 2021-08-22

## 2021-08-20 MED ORDER — PANTOPRAZOLE SODIUM 40 MG PO TBEC
40.0000 mg | DELAYED_RELEASE_TABLET | Freq: Every day | ORAL | Status: DC
  Administered 2021-08-20 – 2021-08-21 (×2): 40 mg via ORAL
  Filled 2021-08-20 (×2): qty 1

## 2021-08-20 MED ORDER — ZOLPIDEM TARTRATE 5 MG PO TABS: 5.0000 mg | ORAL_TABLET | Freq: Every evening | ORAL | Status: DC | PRN

## 2021-08-20 MED ORDER — PRENATAL MULTIVITAMIN CH
1.0000 | ORAL_TABLET | Freq: Every day | ORAL | Status: DC
  Administered 2021-08-20 – 2021-08-21 (×2): 1 via ORAL
  Filled 2021-08-20 (×2): qty 1

## 2021-08-20 MED ORDER — TETANUS-DIPHTH-ACELL PERTUSSIS 5-2.5-18.5 LF-MCG/0.5 IM SUSY: 0.5000 mL | PREFILLED_SYRINGE | Freq: Once | INTRAMUSCULAR | Status: DC

## 2021-08-20 MED ORDER — OXYCODONE HCL 5 MG PO TABS: 10.0000 mg | ORAL_TABLET | ORAL | Status: DC | PRN

## 2021-08-20 MED ORDER — SENNOSIDES-DOCUSATE SODIUM 8.6-50 MG PO TABS
2.0000 | ORAL_TABLET | Freq: Every day | ORAL | Status: DC
  Administered 2021-08-21: 2 via ORAL
  Filled 2021-08-20: qty 2

## 2021-08-20 MED ORDER — ONDANSETRON HCL 4 MG PO TABS: 4.0000 mg | ORAL_TABLET | ORAL | Status: DC | PRN

## 2021-08-20 MED ORDER — OXYCODONE HCL 5 MG PO TABS: 5.0000 mg | ORAL_TABLET | ORAL | Status: DC | PRN

## 2021-08-20 NOTE — Progress Notes (Signed)
Patient requested that  Rn call to get an order for omperzole 40mg  that she takes daily for acid reflux. Rn called [facility] and got a verbal order for the medication. Pharmacy does not carry Omperzole 40 mg pharmacy uses pantoprazole 40 mg as an alternative. Called Kim to make sure that she was ok with pantoprazole 40mg  states that it was ok.

## 2021-08-20 NOTE — Lactation Note (Signed)
This note was copied from a baby's chart. Lactation Consultation Note  Patient Name: Carrie Coffey Today's Date: 08/20/2021   Age:36 hours  LC received a call from L&D  nurse to inform that baby was latched well to the breast.  LC to F/U on MBU.   Broadus John 08/20/2021, 12:28 PM

## 2021-08-20 NOTE — Progress Notes (Signed)
Subjective:    Pt resting comfortably. No complaints. POC including AROM discussed. Pt verbalized understanding. Will recheck cervix at 1030.  Objective:    VS: BP (!) 98/43    Pulse 77    Temp 98.1 F (36.7 C) (Oral)    Resp 18    Ht 5\' 1"  (1.549 m)    Wt 91.7 kg    BMI 38.21 kg/m  FHR : baseline 135 / variability moderate / accelerations present / absent decelerations Toco: contractions every 3 minutes  Membranes: Intact Dilation: 5 Effacement (%): 50 Cervical Position: Middle Station: -3 Presentation: Vertex Exam by:: BHanks RN Pitocin 18 mU/min  Assessment/Plan:   Carrie Coffey is a 36 y.o. female, [id number], IUP at 39 weeks, presenting for IOL for GDMA1. GBS-. BPP 8/8 2/9. Growth 1/27, vertex, posterior placenta, Afi 20.7, BPP 8/8, 7.7lbs 84%.  Labor: Progressing on Pitocin, will continue to increase then AROM Preeclampsia:  no signs or symptoms of toxicity Fetal Wellbeing:  Category I Pain Control:  Epidural I/D:   GBS negative Anticipated MOD:  NSVD Dr Charlesetta Garibaldi aware of pt status and [place] MSN, CNM 08/20/2021 9:15 AM

## 2021-08-20 NOTE — Lactation Note (Signed)
This note was copied from a baby's chart. Lactation Consultation Note  Patient Name: Carrie Coffey [id number] Date: 08/20/2021 Reason for consult: Initial assessment;Mother's request;Term;Maternal endocrine disorder;Breastfeeding assistance Age:36 hours  LC assisted with adjusting the latch to get more depth on the breast. Infant showed increase in signs of milk transfer with breast compression.   Mom feeding plan to EBF.   Plan 1. To feed based on cues 8-12x 24hr period. Mom to offer breasts and look for signs of milk transfer. Mom noting uterine contraction in midst of the visit.  2. Mom to offer eBM via spoon with hand expression of unable to get infant to latch.  3. I and O sheet reviewed.  All questions answered in midst of the visit.   Maternal Data Has patient been taught Hand Expression?: Yes Does the patient have breastfeeding experience prior to this delivery?: Yes How long did the patient breastfeed?: 1 year with 2 other children  Feeding Mother's Current Feeding Choice: Breast Milk  LATCH Score Latch: Grasps breast easily, tongue down, lips flanged, rhythmical sucking.  Audible Swallowing: A few with stimulation  Type of Nipple: Everted at rest and after stimulation  Comfort (Breast/Nipple): Soft / non-tender  Hold (Positioning): Assistance needed to correctly position infant at breast and maintain latch.  LATCH Score: 8   Lactation Tools Discussed/Used    Interventions Interventions: Breast feeding basics reviewed;Position options;Support pillows;Education;Skin to skin;Expressed milk;Breast massage;Hand express;Control and instrumentation engineer;Adjust position;Breast compression  Discharge Pump: Personal WIC Program: No  Consult Status Consult Status: Follow-up Date: 08/21/21 Follow-up type: In-patient    Iola Turri  [place] 08/20/2021, 3:59 PM

## 2021-08-20 NOTE — Anesthesia Postprocedure Evaluation (Signed)
Anesthesia Post Note  Patient: Carrie Coffey  Procedure(s) Performed: AN AD [place]     Patient location during evaluation: Mother Baby Anesthesia Type: Epidural Level of consciousness: awake and alert Pain management: pain level controlled Vital Signs Assessment: post-procedure vital signs reviewed and stable Respiratory status: spontaneous breathing, nonlabored ventilation and respiratory function stable Cardiovascular status: stable Postop Assessment: no headache, no backache, epidural receding, no apparent nausea or vomiting, patient able to bend at knees, able to ambulate and adequate PO intake Anesthetic complications: no   No notable events documented.  Last Vitals:  Vitals:   08/20/21 1405 08/20/21 1502  BP: 112/64 108/70  Pulse: 100 (!) 101  Resp: 18 18  Temp: 36.5 C 36.8 C  SpO2: 100% 100%    Last Pain:  Vitals:   08/20/21 1502  TempSrc: Oral  PainSc:    Pain Goal: Patients Stated Pain Goal: 0 (08/19/21 1910)                 Jabier Mutton

## 2021-08-20 NOTE — Progress Notes (Signed)
Labor Progress Note.    Carrie Coffey is a 36 y.o. female, [phone], IUP at 36 weeks, presenting for IOL for GDMA1. GBS-. BPP 8/8 2/9. Growth 1/27, vertex, posterior placenta, Afi 20.7, BPP 8/8, 7.7lbs 84%.   Subjective: Pt comfortable with epidural still, having bloody show and making change. Pt feeling hopeless about birth, discussed with pt length of time again. Pt ready to progress.         Patient Active Problem List    Diagnosis Date Noted   Gestational diabetes mellitus (GDM) 08/19/2021   Anxiety 08/11/2020   History of multiple miscarriages--x 3 08/11/2020   History of ectopic pregnancy 08/11/2020    Objective: BP 133/70    Pulse 96    Temp 98.4 F (36.9 C) (Oral)    Resp 16    Ht 5\' 1"  (1.549 m)    Wt 91.7 kg    BMI 38.21 kg/m  No intake/output data recorded. No intake/output data recorded. NST: FHR baseline 145 bpm, Variability: moderate, Accelerations:present, Decelerations:  Absent= Cat 1/Reactive CTX:  regular, every 3-6 minutes Uterus gravid, soft non tender, moderate to palpate with contractions.  SVE:  Dilation: 5 Effacement (%): 50 Station: -3 Exam by:: [facility] CNM Pitocin 10 mUn/min     Assessment:  Carrie Coffey is a 36 y.o. female, [phone], IUP at 39 weeks, presenting for IOL for GDMA1. GBS-. BPP 8/8 2/9. Growth 1/27, vertex, posterior placenta, Afi 20.7, BPP 8/8, 7.7lbs 84%.   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 Recurrent pregnancy loss testing in  12/2020 was significant for trisomy in fetal karyotype.  Had a negative recurrent miscarriage work up.  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.) subchorionic hematoma (x 2 noted at 6 weeks, recheck at [place].) uterine leiomyoma (Pendunculated left fundal noted at anatomy, 2 cm.)        Patient Active Problem List    Diagnosis Date Noted   Gestational diabetes mellitus (GDM) 08/19/2021   Anxiety 08/11/2020   History of multiple miscarriages--x 3 08/11/2020   History of ectopic pregnancy 08/11/2020    NICHD: Category 1   Membranes:  Intact, no s/s of infection   Asynclitic position maternal left is fetal head, and maternal right is fetal body    Induction:               Cytotec xNO             Foley Bulb: inserted placed at 1000, out at 1400 2/11             Pitocin - 10   Pain management:               IV pain management: xPRN             Nitrous: PRN  Epidural placement: Placed at 1947 2/11   GBS Negative   A1GDM: stable sugars CBG (last 3)     Recent Labs (last 2 labs)               Recent Labs   08/19/21 1122 2/11 1517 2/11 1916 2/12 0321 08/19/21 1517 2/12 0321  GLUCAP 104* 150 120 121 150* 121      Plan: Continue labor plan Continuous monitoring Rest Frequent position changes to facilitate fetal rotation and descent. Will reassess with cervical exam at 4 hours or earlier if necessary Continue pitocin 2x2 Anticipate AROM with DR Charlesetta Garibaldi possible later today.   Anticipate labor progression and vaginal delivery.     Kim CNM to assumed care for pt.   Noralyn Pick, NP-C, CNM, MSN 08/20/2021. 6:20 AM

## 2021-08-20 NOTE — Social Work (Signed)
MOB was referred for history of anxiety.   * Referral screened out by Clinical Social Worker because none of the following criteria appear to apply:  ~ History of anxiety/depression during this pregnancy, or of post-partum depression following prior delivery. ~ Diagnosis of anxiety and/or depression within last 3 years. OR * MOB's symptoms currently being treated with medication and/or therapy.  Please contact the Clinical Social Worker if needs arise, by [facility] request, or if MOB scores greater than 9/yes to question 10 on Edinburgh Postpartum Depression Screen.  Darra Lis, [employer]  [phone]

## 2021-08-20 NOTE — Progress Notes (Signed)
Subjective:    SROM, light meconium. No c/o pressure. Pt comfortable.  Objective:    VS: BP 107/61    Pulse (!) 106    Temp 98.1 F (36.7 C) (Oral)    Resp 18    Ht 5\' 1"  (1.549 m)    Wt 91.7 kg    BMI 38.21 kg/m  FHR : baseline 140 / variability moderate / accelerations absent / early decelerations Toco: contractions every 3 minutes  Membranes: SROM light mec @ 0940 SVE: 6/90/0 Pitocin 18 mU/min  Assessment/Plan:   Carrie Coffey is a 36 y.o. female, [id number], IUP at 62 weeks, presenting for IOL for GDMA1. GBS-. BPP 8/8 2/9. Growth 1/27, vertex, posterior placenta, Afi 20.7, BPP 8/8, 7.7lbs 84%.    Labor: Progressing normally on pitocin Preeclampsia:  no signs or symptoms of toxicity Fetal Wellbeing:  Category I Pain Control:  Epidural I/D:  n/a Anticipated MOD:  NSVD Dr Charlesetta Garibaldi notified of pt status and [place] MSN, CNM 08/20/2021 10:31 AM

## 2021-08-20 NOTE — Progress Notes (Addendum)
Labor Progress Note.    Carrie Coffey is a 36 y.o. female, [phone], IUP at 55 weeks, presenting for IOL for GDMA1. GBS-. BPP 8/8 2/9. Growth 1/27, vertex, posterior placenta, Afi 20.7, BPP 8/8, 7.7lbs 84%.   Subjective: Pt comfortable with epidural, POC reviewed. Pt stable with support at bedside. RN called and reported late decels noted, orders given to half pitocin. Maternal position change and fluid bolus.         Patient Active Problem List    Diagnosis Date Noted   Gestational diabetes mellitus (GDM) 08/19/2021   Anxiety 08/11/2020   History of multiple miscarriages--x 3 08/11/2020   History of ectopic pregnancy 08/11/2020    Objective: BP 121/64    Pulse 80    Temp 98.4 F (36.9 C) (Oral)    Resp 16    Ht 5\' 1"  (1.549 m)    Wt 91.7 kg    BMI 38.21 kg/m  No intake/output data recorded. No intake/output data recorded. NST: FHR baseline 145 bpm, Variability: moderate, Accelerations:present, Decelerations:  Absent= Cat 1/Reactive CTX:  regular, every 3-6 minutes Uterus gravid, soft non tender, moderate to palpate with contractions.  SVE:  Dilation: 4 Effacement (%): 50 Station: Ballotable Exam by:: [employer] CNM Pitocin turned off mUn/min Foley bulb out, bloody show.     Assessment:  Carrie Coffey is a 36 y.o. female, [phone], IUP at 39 weeks, presenting for IOL for GDMA1. GBS-. BPP 8/8 2/9. Growth 1/27, vertex, posterior placenta, Afi 20.7, BPP 8/8, 7.7lbs 84%.   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 Recurrent pregnancy loss testing in 12/2020 was significant for trisomy in fetal karyotype.  Had a negative recurrent miscarriage work up.  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.) subchorionic hematoma (x 2 noted at 6 weeks, recheck at [place].) uterine leiomyoma (Pendunculated left fundal noted at anatomy, 2 cm.)        Patient Active Problem List    Diagnosis Date Noted   Gestational diabetes mellitus (GDM) 08/19/2021   Anxiety 08/11/2020   History of multiple miscarriages--x 3 08/11/2020   History of ectopic pregnancy 08/11/2020    NICHD: Category 1   Membranes:  Intact, no s/s of infection   Asynclitic position maternal left is fetal head, and maternal right is fetal body    Induction:               Cytotec xNO             Foley Bulb: inserted placed at 1000, out at 1400 2/11             Pitocin - was on 10 and half to 5 for decels.    Pain management:               IV pain  management: xPRN             Nitrous: PRN             Epidural placement: Placed at 1947 2/11   GBS Negative   A1GDM: stable sugars CBG (last 3)  Recent Labs (last 2 labs)              Recent Labs   08/19/21 1122 2/11 1517 2/11 1916 2/12 0321 08/19/21 1517  GLUCAP 104* 150 120 121 150*      Plan: Continue labor plan Continuous monitoring Rest Frequent position changes to facilitate fetal rotation and descent. Will reassess with cervical exam at 4 hours or earlier if necessary Continue pitocin 1x1.  Anticipate labor progression and vaginal delivery.      Noralyn Pick, NP-C, CNM, MSN 08/20/2021. 2:20 AM

## 2021-08-21 LAB — CBC
HCT: 30.8 % — ABNORMAL LOW (ref 36.0–46.0)
Hemoglobin: 10.1 g/dL — ABNORMAL LOW (ref 12.0–15.0)
MCH: 31.4 pg (ref 26.0–34.0)
MCHC: 32.8 g/dL (ref 30.0–36.0)
MCV: 95.7 fL (ref 80.0–100.0)
Platelets: 150 10*3/uL (ref 150–400)
RBC: 3.22 MIL/uL — ABNORMAL LOW (ref 3.87–5.11)
RDW: 14.3 % (ref 11.5–15.5)
WBC: 13.1 10*3/uL — ABNORMAL HIGH (ref 4.0–10.5)
nRBC: 0 % (ref 0.0–0.2)

## 2021-08-21 LAB — GLUCOSE, CAPILLARY: Glucose-Capillary: 80 mg/dL (ref 70–99)

## 2021-08-21 MED ORDER — SENNOSIDES-DOCUSATE SODIUM 8.6-50 MG PO TABS: 2.0000 | ORAL_TABLET | Freq: Every evening | ORAL | 2 refills | Status: DC | PRN

## 2021-08-21 MED ORDER — IBUPROFEN 600 MG PO TABS
600.0000 mg | ORAL_TABLET | Freq: Four times a day (QID) | ORAL | 2 refills | Status: AC | PRN
Start: 2021-08-21 — End: ?

## 2021-08-21 NOTE — Lactation Note (Signed)
This note was copied from a baby's chart. Lactation Consultation Note  Patient Name: Carrie Coffey [id number] Date: 08/21/2021 Reason for consult: Follow-up assessment;Other (Comment) (per [facility] Heather Gaither mom and baby for D/C and mom felt LC answered all her questions earlier for lactation.) Age:36 hours  Maternal Data    Feeding Mother's Current Feeding Choice: Breast Milk  LATCH Score                    Lactation Tools Discussed/Used    Interventions    Discharge    Consult Status Consult Status: Complete Date: 08/21/21    Myer Haff 08/21/2021, 4:56 PM

## 2021-08-21 NOTE — Lactation Note (Signed)
This note was copied from a baby's chart. Lactation Consultation Note  Patient Name: Carrie Coffey [id number] Date: 08/21/2021 Reason for consult: Maternal endocrine disorder;Follow-up assessment;Term;Infant weight loss;Other (Comment) (per mom the baby recently fed at 11:05 - 11:12, swallows noted and comfortable. grandmother holding sleeping baby. LC reviewed and updated the doc flow sheets per mom. mom aware she can call for latch assistance.) Age:36 hours Per mom having some nipple soreness, but not with the baby feeding.  Per mom felt it was from pumping prior to delivery.  Maternal Data    Feeding Mother's Current Feeding Choice: Breast Milk  LATCH Score Latch: Grasps breast easily, tongue down, lips flanged, rhythmical sucking.  Audible Swallowing: A few with stimulation  Type of Nipple: Everted at rest and after stimulation  Comfort (Breast/Nipple): Soft / non-tender  Hold (Positioning): No assistance needed to correctly position infant at breast.  LATCH Score: 9   Lactation Tools Discussed/Used    Interventions Interventions: Breast feeding basics reviewed;Education  Discharge    Consult Status Consult Status: Follow-up Date: 08/22/21 Follow-up type: In-patient    [place] 08/21/2021, 11:45 AM

## 2021-08-21 NOTE — Discharge Summary (Signed)
[facility] Discharge Summary   Patient Name:   Carrie Coffey DOB:     [birth year] MRN:     [id number]  Date of Admission:   08/19/2021 Date of Discharge:  08/21/2021  Admitting diagnosis:   AMA Gestational diabetes mellitus (GDM) [O24.419] Principal Problem:   Gestational diabetes mellitus (GDM)     Discharge diagnosis:    Gestational diabetes mellitus (GDM) [O24.419] Principal Problem:   Gestational diabetes mellitus (GDM)  Term Pregnancy Delivered        Additional problems: None                                          Post partum procedures: none Augmentation: Pitocin and IP Foley Complications: None  Hospital course: Induction of Labor With Vaginal Delivery   36 y.o. yo [id number] at 51w1dwas admitted to the hospital 08/19/2021 for induction of labor.  Indication for induction: A1 DM and AMA.  Patient had an uncomplicated labor course as follows: Membrane Rupture Time/Date: 9:40 AM ,08/20/2021   Delivery Method:Vaginal, Spontaneous  Episiotomy: None  Lacerations:  None  Details of delivery can be found in separate delivery note.  Patient had a routine postpartum course. Patient is discharged home 08/21/21.  Newborn Data: Birth date:08/20/2021  Birth time:11:29 AM  Gender:Female  Living status:Living  Apgars:9 ,9  Weight:3690 g   Magnesium Sulfate received: No BMZ received: No Rhophylac:N/A MMR:No T-DaP:Given prenatally Flu: No Transfusion:No                                                               Type of Delivery:  NSVD Delivering Provider: HDomingo Pulse Date of Delivery:  08/20/21  Newborn Data:  Baby Feeding:   Breast Disposition:   home with mother  Physical Exam:   Vitals:   08/20/21 1848 08/20/21 2053 08/20/21 2349 08/21/21 0510  BP: 108/65 116/72 103/65 120/75  Pulse: 99 98 98 80  Resp: '18 18 18 18  ' Temp: 97.7 F (36.5 C) 98.1 F (36.7 C) 98.2 F (36.8 C) 97.8 F (36.6 C)  TempSrc: Oral Oral Oral Oral   SpO2: 100% 100% 100% 100%  Weight:      Height:       General: alert, cooperative, and no distress Lochia: appropriate Uterine Fundus: firm Incision: N/A DVT Evaluation: No evidence of DVT seen on physical exam. Negative Homan's sign. No cords or calf tenderness.  Labs: Lab Results  Component Value Date   WBC 13.1 (H) 08/21/2021   HGB 10.1 (L) 08/21/2021   HCT 30.8 (L) 08/21/2021   MCV 95.7 08/21/2021   PLT 150 08/21/2021   CMP Latest Ref Rng & Units 10/28/2020  Glucose 70 - 99 mg/dL 85  BUN 6 - 20 mg/dL <5(L)  Creatinine 0.44 - 1.00 mg/dL 0.83  Sodium 135 - 145 mmol/L 139  Potassium 3.5 - 5.1 mmol/L 3.3(L)  Chloride 98 - 111 mmol/L 110  CO2 22 - 32 mmol/L 20(L)  Calcium 8.9 - 10.3 mg/dL 9.2  Total Protein 6.0 - 8.3 g/dL -  Total Bilirubin 0.3 - 1.2 mg/dL -  Alkaline Phos 39 - 117 U/L -  AST 0 - 37  U/L -  ALT 0 - 35 U/L -    Discharge instruction: per After Visit Summary and "Baby and Me Booklet".  After Visit Meds:  Allergies as of 08/21/2021       Reactions   Apple Juice Hives, Swelling   Food Hives, Swelling, Other (See Comments)   Pt states that she is allergic to strawberries.     Pear Hives, Swelling   Allegra Allergy [fexofenadine Hcl] Hives   Pseudoephedrine Hives        Medication List     STOP taking these medications    oxyCODONE-acetaminophen 5-325 MG tablet Commonly known as: Percocet   Vitamin D-3 125 MCG (5000 UT) Tabs       TAKE these medications    ibuprofen 600 MG tablet Commonly known as: ADVIL Take 1 tablet (600 mg total) by mouth every 6 (six) hours as needed for cramping. What changed: reasons to take this   senna-docusate 8.6-50 MG tablet Commonly known as: Senokot-S Take 2 tablets by mouth at bedtime as needed for mild constipation.        Diet: routine diet  Activity: Advance as tolerated. Pelvic rest for 6 weeks.   Outpatient follow up:6 weeks Follow up Appt:No future appointments. Follow up visit: No  follow-ups on file.  Postpartum contraception: Vasectomy  08/21/2021 Sanjuana Kava, MD

## 2021-08-24 ENCOUNTER — Ambulatory Visit: Payer: Self-pay

## 2021-08-24 NOTE — Lactation Note (Addendum)
This note was copied from a baby's chart. Lactation Consultation Note  Patient Name: Carrie Coffey [id number] Date: 08/24/2021 Reason for consult: Initial assessment;MD order, jaundice infant, -11% weight loss and only one stool since birth. Age:36 years Mom's current feeding plan is breast feeding and supplementing infant with donor breast milk. Mom latched infant on her right breast using the cross cradle hold position, infant latched with depth and BF for 17 minutes. LC suggested skin to skin when breastfeeding infant, do breast stimulation techniques to keep infant actively feeding at the breast. Afterwards infant was given 30 mls of donor breast milk but only consumed 21 mls of donor breast milk , mom will attempt offer the rest of the donor breast milk  in another 30 minutes. Mom was using the DEBP as LC left the room, and breast milk was being expressed in breast flanges. Mom expressed 5 mls of EBM  using the DEBP Mom knows EBM is good for 4 hours at room temperature whereas donor breast milk at room temperature is only good for 1 hour.  Mom's plan: 1- Breastfeed infant according to hunger cues, 8 to 12+ or more times within 24 hours, skin to skin. 2- Afterwards mom will supplement infant with any EBM first that she pumped before offering donor breast milk. 3- Mom understands at Day 36 infant's  intake of supplement  EBM/Donor milk is  28 to 42 mls per feeding. 4- Mom will continue to use DEBP every 3 hours for 15 minutes on initial setting. Maternal Data    Feeding Mother's Current Feeding Choice: Breast Milk and Donor Milk  LATCH Score Latch: Grasps breast easily, tongue down, lips flanged, rhythmical sucking.  Audible Swallowing: Spontaneous and intermittent  Type of Nipple: Everted at rest and after stimulation  Comfort (Breast/Nipple): Soft / non-tender  Hold (Positioning): Assistance needed to correctly position infant at breast and maintain latch.  LATCH Score:  9   Lactation Tools Discussed/Used Tools: Pump Breast pump type: Double-Electric Breast Pump Pump Education: Setup, frequency, and cleaning;Milk Storage Reason for Pumping: Infant with hight weight loss -11%, jaundice,  to provide extra volume of EBM and help estblish mom's milk supply. Pumping frequency: Mom will pump every 3 hours for 15 minutes on inital setting.  Interventions Interventions: Breast feeding basics reviewed;Assisted with latch;Skin to skin;Breast compression;Adjust position;Support pillows;Position options;DEBP;Education;Pace feeding;LC Services brochure  Discharge Pump: Personal (Per mom, she has Spectra DEBP at home.)  Consult Status Consult Status: Follow-up Date: 08/25/21 Follow-up type: In-patient    Vicente Serene 08/24/2021, 6:04 PM

## 2021-08-25 ENCOUNTER — Ambulatory Visit: Payer: Self-pay

## 2021-08-25 NOTE — Lactation Note (Signed)
This note was copied from a baby's chart. Lactation Consultation Note  Patient Name: Carrie Coffey [id number] Date: 08/25/2021 Reason for consult: Follow-up assessment Age:36 years  P4, Weight increased.  Mother has been supplementing with donor milk.  She has pumped 5 ml and will continue pumping.  She feels her breasts are filling.  Her plans are to supplement once home with her milk and formula if needed.  Mother states baby is latching well.   Recommend continuing to pump q 3 hours and give volume back to baby at next feeding. Reviewed engorgement care and monitoring voids/stools.  Feeding Mother's Current Feeding Choice: Breast Milk and Donor Milk   Interventions Interventions: Breast feeding basics reviewed;Education;DEBP  Discharge Discharge Education: Engorgement and breast care;Warning signs for feeding baby Pump: Personal;DEBP (Spectra, Medela)  Consult Status Consult Status: Complete Date: 08/25/21    Vivianne Master [facility] 08/25/2021, 8:34 AM

## 2021-08-30 ENCOUNTER — Telehealth (HOSPITAL_COMMUNITY): Payer: Self-pay | Admitting: *Deleted

## 2021-08-30 NOTE — Telephone Encounter (Signed)
Mom reports feeling good. No concerns about herself at this time. EPDS not completed at this time. Mom asked for call back tomorrow afternoon to complete it. Hospital score =5. Mom reports baby is doing well. Feeding, peeing, and pooping without difficulty. Safe sleep reviewed. Mom reports no concerns about baby at present.  Odis Hollingshead, RN 08-30-2021 at 1:30pm

## 2022-12-11 ENCOUNTER — Encounter (HOSPITAL_BASED_OUTPATIENT_CLINIC_OR_DEPARTMENT_OTHER): Payer: Self-pay | Admitting: Emergency Medicine

## 2022-12-11 ENCOUNTER — Emergency Department (HOSPITAL_BASED_OUTPATIENT_CLINIC_OR_DEPARTMENT_OTHER): Payer: BC Managed Care – PPO

## 2022-12-11 ENCOUNTER — Other Ambulatory Visit: Payer: Self-pay

## 2022-12-11 ENCOUNTER — Inpatient Hospital Stay (HOSPITAL_BASED_OUTPATIENT_CLINIC_OR_DEPARTMENT_OTHER)
Admission: EM | Admit: 2022-12-11 | Discharge: 2022-12-15 | DRG: 175 | Disposition: A | Discharge status: Home / Self Care | Payer: BC Managed Care – PPO | Attending: Family Medicine | Admitting: Family Medicine

## 2022-12-11 DIAGNOSIS — Z888 Allergy status to other drugs, medicaments and biological substances status: Secondary | ICD-10-CM

## 2022-12-11 DIAGNOSIS — J9601 Acute respiratory failure with hypoxia: Secondary | ICD-10-CM | POA: Diagnosis present

## 2022-12-11 DIAGNOSIS — J189 Pneumonia, unspecified organism: Secondary | ICD-10-CM | POA: Diagnosis present

## 2022-12-11 DIAGNOSIS — G8929 Other chronic pain: Secondary | ICD-10-CM | POA: Diagnosis present

## 2022-12-11 DIAGNOSIS — I2699 Other pulmonary embolism without acute cor pulmonale: Principal | ICD-10-CM | POA: Diagnosis present

## 2022-12-11 DIAGNOSIS — L409 Psoriasis, unspecified: Secondary | ICD-10-CM | POA: Diagnosis present

## 2022-12-11 DIAGNOSIS — I2694 Multiple subsegmental pulmonary emboli without acute cor pulmonale: Principal | ICD-10-CM

## 2022-12-11 DIAGNOSIS — J90 Pleural effusion, not elsewhere classified: Secondary | ICD-10-CM | POA: Diagnosis present

## 2022-12-11 DIAGNOSIS — Z91018 Allergy to other foods: Secondary | ICD-10-CM

## 2022-12-11 DIAGNOSIS — F419 Anxiety disorder, unspecified: Secondary | ICD-10-CM | POA: Diagnosis present

## 2022-12-11 LAB — URINALYSIS, ROUTINE W REFLEX MICROSCOPIC
Bilirubin Urine: NEGATIVE
Glucose, UA: NEGATIVE mg/dL
Ketones, ur: 15 mg/dL — AB
Nitrite: NEGATIVE
Protein, ur: NEGATIVE mg/dL
Specific Gravity, Urine: 1.02 (ref 1.005–1.030)
pH: 7.5 (ref 5.0–8.0)

## 2022-12-11 LAB — CBC
HCT: 34.9 % — ABNORMAL LOW (ref 36.0–46.0)
Hemoglobin: 11.3 g/dL — ABNORMAL LOW (ref 12.0–15.0)
MCH: 31.3 pg (ref 26.0–34.0)
MCHC: 32.4 g/dL (ref 30.0–36.0)
MCV: 96.7 fL (ref 80.0–100.0)
Platelets: 258 10*3/uL (ref 150–400)
RBC: 3.61 MIL/uL — ABNORMAL LOW (ref 3.87–5.11)
RDW: 13.2 % (ref 11.5–15.5)
WBC: 9.6 10*3/uL (ref 4.0–10.5)
nRBC: 0 % (ref 0.0–0.2)

## 2022-12-11 LAB — COMPREHENSIVE METABOLIC PANEL
ALT: 18 U/L (ref 0–44)
AST: 20 U/L (ref 15–41)
Albumin: 3.1 g/dL — ABNORMAL LOW (ref 3.5–5.0)
Alkaline Phosphatase: 46 U/L (ref 38–126)
Anion gap: 8 (ref 5–15)
BUN: 9 mg/dL (ref 6–20)
CO2: 22 mmol/L (ref 22–32)
Calcium: 7.9 mg/dL — ABNORMAL LOW (ref 8.9–10.3)
Chloride: 104 mmol/L (ref 98–111)
Creatinine, Ser: 0.72 mg/dL (ref 0.44–1.00)
GFR, Estimated: >60 mL/min (ref >60)
Glucose, Bld: 106 mg/dL — ABNORMAL HIGH (ref 70–99)
Potassium: 3.6 mmol/L (ref 3.5–5.1)
Sodium: 134 mmol/L — ABNORMAL LOW (ref 135–145)
Total Bilirubin: 0.7 mg/dL (ref 0.3–1.2)
Total Protein: 8.3 g/dL — ABNORMAL HIGH (ref 6.5–8.1)

## 2022-12-11 LAB — URINALYSIS, MICROSCOPIC (REFLEX)

## 2022-12-11 LAB — LACTIC ACID, PLASMA: Lactic Acid, Venous: 0.7 mmol/L (ref 0.5–1.9)

## 2022-12-11 LAB — PREGNANCY, URINE: Preg Test, Ur: NEGATIVE

## 2022-12-11 LAB — LIPASE, BLOOD: Lipase: 22 U/L (ref 11–51)

## 2022-12-11 MED ORDER — IOHEXOL 300 MG/ML  SOLN
100.0000 mL | Freq: Once | INTRAMUSCULAR | Status: AC | PRN
  Administered 2022-12-11: 100 mL via INTRAVENOUS

## 2022-12-11 MED ORDER — PIPERACILLIN-TAZOBACTAM 3.375 G IVPB
3.3750 g | Freq: Three times a day (TID) | INTRAVENOUS | Status: DC
  Administered 2022-12-12 – 2022-12-15 (×10): 3.375 g via INTRAVENOUS
  Filled 2022-12-11 (×11): qty 50

## 2022-12-11 MED ORDER — VANCOMYCIN HCL IN DEXTROSE 1-5 GM/200ML-% IV SOLN: 1000.0000 mg | Freq: Once | INTRAVENOUS | Status: DC

## 2022-12-11 MED ORDER — PIPERACILLIN-TAZOBACTAM 3.375 G IVPB 30 MIN
3.3750 g | Freq: Once | INTRAVENOUS | Status: AC
  Administered 2022-12-11: 3.375 g via INTRAVENOUS
  Filled 2022-12-11: qty 50

## 2022-12-11 MED ORDER — VANCOMYCIN HCL IN DEXTROSE 1-5 GM/200ML-% IV SOLN: 1000.0000 mg | INTRAVENOUS | Status: DC

## 2022-12-11 MED ORDER — HEPARIN (PORCINE) 25000 UT/250ML-% IV SOLN
1850.0000 [IU]/h | INTRAVENOUS | Status: DC
  Administered 2022-12-11: 1000 [IU]/h via INTRAVENOUS
  Administered 2022-12-13: 1700 [IU]/h via INTRAVENOUS
  Administered 2022-12-14: 1850 [IU]/h via INTRAVENOUS
  Filled 2022-12-11 (×4): qty 250

## 2022-12-11 MED ORDER — SODIUM CHLORIDE 0.9 % IV SOLN
1.0000 g | Freq: Once | INTRAVENOUS | Status: DC
  Administered 2022-12-11: 1 g via INTRAVENOUS

## 2022-12-11 MED ORDER — SODIUM CHLORIDE 0.9 % IV BOLUS
1000.0000 mL | Freq: Once | INTRAVENOUS | Status: AC
  Administered 2022-12-11: 1000 mL via INTRAVENOUS

## 2022-12-11 MED ORDER — ONDANSETRON HCL 4 MG/2ML IJ SOLN
4.0000 mg | Freq: Once | INTRAMUSCULAR | Status: AC
  Administered 2022-12-11: 4 mg via INTRAVENOUS
  Filled 2022-12-11: qty 2

## 2022-12-11 MED ORDER — VANCOMYCIN HCL IN DEXTROSE 1-5 GM/200ML-% IV SOLN
1000.0000 mg | Freq: Once | INTRAVENOUS | Status: AC
  Administered 2022-12-12: 1000 mg via INTRAVENOUS
  Filled 2022-12-11: qty 200

## 2022-12-11 MED ORDER — HYDROMORPHONE HCL 1 MG/ML IJ SOLN
0.5000 mg | Freq: Once | INTRAMUSCULAR | Status: AC
  Administered 2022-12-11: 0.5 mg via INTRAVENOUS
  Filled 2022-12-11: qty 1

## 2022-12-11 MED ORDER — FENTANYL CITRATE PF 50 MCG/ML IJ SOSY
50.0000 ug | PREFILLED_SYRINGE | Freq: Once | INTRAMUSCULAR | Status: AC
  Administered 2022-12-11: 50 ug via INTRAVENOUS
  Filled 2022-12-11: qty 1

## 2022-12-11 MED ORDER — IOHEXOL 350 MG/ML SOLN
60.0000 mL | Freq: Once | INTRAVENOUS | Status: AC | PRN
  Administered 2022-12-11: 60 mL via INTRAVENOUS

## 2022-12-11 MED ORDER — HEPARIN BOLUS VIA INFUSION
3700.0000 [IU] | Freq: Once | INTRAVENOUS | Status: AC
  Administered 2022-12-11: 3700 [IU] via INTRAVENOUS

## 2022-12-11 MED ORDER — VANCOMYCIN HCL 500 MG IV SOLR: 500.0000 mg | INTRAVENOUS | Status: DC

## 2022-12-11 MED ORDER — VANCOMYCIN HCL IN DEXTROSE 1-5 GM/200ML-% IV SOLN
1000.0000 mg | Freq: Once | INTRAVENOUS | Status: AC
  Administered 2022-12-11: 1000 mg via INTRAVENOUS
  Filled 2022-12-11: qty 200

## 2022-12-11 MED ORDER — VANCOMYCIN HCL 750 MG/150ML IV SOLN: 750.0000 mg | Freq: Once | INTRAVENOUS | Status: DC

## 2022-12-11 NOTE — Progress Notes (Addendum)
Pharmacy Antibiotic Note  Carrie Coffey is a 37 y.o. female admitted on 12/11/2022 with  Intra-abdominal infection and PNA . She reports RUQ ABD pain that started last Wednesday and has continued to worsen. Some reports of nausea and gas, no V/D. She also reports fever, but has been afebrile since admission. Pharmacy has been consulted for Zosyn and vancomycin dosing. WBC 9.6, afebrile, sCr 0.72  Plan: Zosyn 3.375g IV q8h (4 hour infusion) Vancomycin 2000 mg IV x1 Vancomycin 1500 mg IV every 24 hours (AUC 458, Vd 0.72, sCr 0.72) Monitor renal function Monitor for signs of clinical improvement, fever curve, WBC, and LOT F/u ABD [place]  Height: 5\' 1"  (154.9 cm) Weight: 70.3 kg (155 lb) IBW/kg (Calculated) : 47.8  Temp (24hrs), Avg:99.9 F (37.7 C), Min:99.9 F (37.7 C), Max:99.9 F (37.7 C)  Recent Labs  Lab 12/11/22 1938  WBC 9.6    CrCl cannot be calculated (Patient's most recent lab result is older than the maximum 21 days allowed.).    Allergies  Allergen Reactions   Apple Juice Hives and Swelling   Food Hives, Swelling and Other (See Comments)    Pt states that she is allergic to strawberries.     Pear Hives and Swelling   Allegra Allergy [Fexofenadine Hcl] Hives   Pseudoephedrine Hives    Antimicrobials this admission: Zosyn 6/4 >>   Microbiology results: 6/4 Bcx:  Thank you for allowing pharmacy to be a part of this patient's care.  Arabella Merles, PharmD. [facility] PGY-1 12/11/2022 7:59 PM

## 2022-12-11 NOTE — H&P (Signed)
NAME:  Carrie Coffey, MRN:  [id number], DOB:  1985-09-16, LOS: 0 ADMISSION DATE:  12/11/2022 CONSULTATION DATE:  12/12/2022 REFERRING MD:  Deretha Emory - EDP CHIEF COMPLAINT:  SOB, RUQ pain, R shoulder pain   History of Present Illness:  37 year old woman who presented to [facility] 6/4 for SOB, RUQ abdominal pain and R shoulder pain x 1 week. PMHx significant for anxiety, prior UTI, psoriasis. Recent history of R rotator cuff injury, seen by Ortho 6/3 with plan for MRI.  On ED arrival, patient reported a low-grade fever at home as well as worsening RUQ pain/shoulder pain and pain with deep inspiration; she was febrile to 99.55F, tachycardic to 126, normotensive (145/83), RR 20, SpO2 100%. EKG showed ST with nonspecific T-wave abnormalities. Labs were notable for WBC 9.6, Hgb 11.3, Plt 258. Na 134, K 3.6, CO2 22, BUN/Cr 9/0.72 (baseline), LFTs WNL, lipase WNL. LA 0.7. UA was cloudy with trace Hgb, trace ketones, did not appear infected. RUQ [place] was obtained without cholelithiasis/acute cholecystitis. CT A/P demonstrated patchy bilateral lower lobe infiltrates, small R pleural effusion, small hiatal hernia; retroareolar lesion (2.7 x 1.6cm). CTA Chest demonstrated bilateral pulmonary embolism with mild R heart strain (RV/LV 1.14) and posterior RLL infiltrate, small R effusion. Heparin gtt was initiated as well as Vanc/Zosyn for broad-spectrum antibiotic coverage.  PCCM consulted for ICU admission/transfer. On arrival to [facility], patient +fever, SOB, mild CP/mild RUQ abdominal pain and R shoulder pain, also reported feeling anxious. Denies n/v/d, HA, chills, significant cough; denies blood in stool/easy bruising. 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  Pertinent Medical History:   Past Medical History:  Diagnosis Date   Anxiety    BV (bacterial vaginosis)    Gestational diabetes    H/O  joint problems    Infection    UTI   Psoriasis    Significant Hospital Events: Including procedures, antibiotic start and stop dates in addition to other pertinent events   6/4 - Presented to [facility] with RUQ pain, R shoulder pain. Found to have bilateral PE with RLL PNA and R pleural effusion. Heparin gtt, V/Z started. PCCM consulted. 6/5 - Transferred to [facility] for further care.  Interim History / Subjective:  PCCM consulted for ICU admission/transfer.  Objective:  Blood pressure 121/71, pulse (!) 101, temperature 100 F (37.8 C), temperature source Oral, resp. rate 19, height 5\' 1"  (1.549 m), weight 70.3 kg, last menstrual period 11/29/2022, SpO2 100 %, unknown if currently breastfeeding.    FiO2 (%):  [100 %] 100 %   Intake/Output Summary (Last 24 hours) at 12/12/2022 0250 Last data filed at 12/12/2022 0200 Gross per 24 hour  Intake 1564.33 ml  Output 500 ml  Net 1064.33 ml   Filed Weights   12/11/22 1906  Weight: 70.3 kg   Physical Examination: General: Overall well-appearing young woman in NAD. Pleasant and conversant. HEENT: [place]/AT, anicteric sclera, PERRL, moist mucous membranes. Neuro: Awake, oriented x 4. Responds to verbal stimuli. Following commands consistently. Moves all 4 extremities spontaneously.  CV: Tachycardic, regular rhythm, no m/g/r. PULM: Breathing mildly tachypneic and unlabored on 2LNC. Shallow breaths, splinting 2/2 pain. Lung fields CTAB, diminished at R base. GI: Soft, nondistended. Mildly TTP over RUQ. Extremities: No significant LE edema noted. Skin: Warm/dry, no rashes.  Resolved Hospital Problem List:    Assessment & Plan:  Bilateral pulmonary emboli PESI score 77, Class II, Low Risk:  1.7-3.5% 30-day mortality in this group CTA demonstrating bilateral pulmonary emboli with evidence of R heart strain (RV/LV Ratio 1.14). Management options including conservative management, lytic administration and thrombectomy discussed with decision for conservative  measures at this time. - Therapeutic anticoagulation with heparin gtt - F/u Echo - F/u LE Dopplers - Trend troponin, BNP - Eventual transition to PO anticoagulation once nearing discharge  RLL PNA Small R pleural effusion - Supplemental O2 support as indicated - Wean O2 for sat > 90% - Pulmonary hygiene - Vanc/Zosyn for broad-spectrum antibiotic coverage - Trend WBC, fever curve; f/u Cx - Follow CXR  R breast retroareolar lesion Noted on CT A/P 6/4. - Follow up needed for better characterization, US/mammogram  Best Practice: (right click and "Reselect all SmartList Selections" daily)   Diet/type: Regular consistency (see orders) DVT prophylaxis: systemic heparin GI prophylaxis: N/A Lines: N/A Foley:  N/A Code Status:  full code Last date of multidisciplinary goals of care discussion [Pending]  Labs:  CBC: Recent Labs  Lab 12/11/22 1938  WBC 9.6  HGB 11.3*  HCT 34.9*  MCV 96.7  PLT 258   Basic Metabolic Panel: Recent Labs  Lab 12/11/22 1938  NA 134*  K 3.6  CL 104  CO2 22  GLUCOSE 106*  BUN 9  CREATININE 0.72  CALCIUM 7.9*   GFR: Estimated Creatinine Clearance: 86.3 mL/min (by C-G formula based on SCr of 0.72 mg/dL). Recent Labs  Lab 12/11/22 1938 12/11/22 2013  WBC 9.6  --   LATICACIDVEN  --  0.7   Liver Function Tests: Recent Labs  Lab 12/11/22 1938  AST 20  ALT 18  ALKPHOS 46  BILITOT 0.7  PROT 8.3*  ALBUMIN 3.1*   Recent Labs  Lab 12/11/22 1938  LIPASE 22   No results for input(s): "AMMONIA" in the last 168 hours.  ABG: No results found for: "PHART", "PCO2ART", "PO2ART", "HCO3", "TCO2", "ACIDBASEDEF", "O2SAT"   Coagulation Profile: No results for input(s): "INR", "PROTIME" in the last 168 hours.  Cardiac Enzymes: No results for input(s): "CKTOTAL", "CKMB", "CKMBINDEX", "TROPONINI" in the last 168 hours.  HbA1C: No results found for: "HGBA1C"  CBG: Recent Labs  Lab 12/12/22 0121  GLUCAP 149*    Review of Systems:    Review of systems completed with pertinent positives/negatives outlined in above HPI.  Past Medical History:  She,  has a past medical history of Anxiety, BV (bacterial vaginosis), Gestational diabetes, H/O joint problems, Infection, and Psoriasis.   Surgical History:   Past Surgical History:  Procedure Laterality Date   DILATION AND CURETTAGE OF UTERUS     DILATION AND EVACUATION N/A 08/12/2020   Procedure: DILATATION AND EVACUATION;  Surgeon: Osborn Coho, MD;  Location: [facility] OR;  Service: Gynecology;  Laterality: N/A;   DILATION AND EVACUATION N/A 10/28/2020   Procedure: DILATATION AND EVACUATION;  Surgeon: Hoover Browns, MD;  Location: [facility] OR;  Service: Gynecology;  Laterality: N/A;   LAPAROSCOPY N/A 09/10/2013   Procedure: LAPAROSCOPY OPERATIVE;  Surgeon: Sharon Seller, DO;  Location: [facility] ORS;  Service: Gynecology;  Laterality: N/A;   UNILATERAL SALPINGECTOMY Left 09/10/2013   Procedure: UNILATERAL SALPINGECTOMY;  Surgeon: Sharon Seller, DO;  Location: [facility] ORS;  Service: Gynecology;  Laterality: Left;   Social History:   reports that she has never smoked. She has never used smokeless tobacco. She reports that she does not currently use alcohol after a past usage of about 1.0 standard drink of alcohol per week. She reports that she does not use  drugs.   Family History:  Her family history is negative for Asthma, Cancer, Diabetes, Hearing loss, Heart disease, Hypertension, and Stroke.   Allergies: Allergies  Allergen Reactions   Apple Juice Hives and Swelling   Food Hives, Swelling and Other (See Comments)    Pt states that she is allergic to strawberries.     Pear Hives and Swelling   Allegra Allergy [Fexofenadine Hcl] Hives   Pseudoephedrine Hives   Home Medications: Prior to Admission medications   Medication Sig Start Date End Date Taking? Authorizing Provider  ibuprofen (ADVIL) 600 MG tablet Take 1 tablet (600 mg total) by mouth every 6 (six) hours as needed for cramping.  08/21/21   Essie Hart, MD  senna-docusate (SENOKOT-S) 8.6-50 MG tablet Take 2 tablets by mouth at bedtime as needed for mild constipation. 08/21/21   Essie Hart, MD   Critical care time:    The patient is critically ill with multiple organ system failure and requires high complexity decision making for assessment and support, frequent evaluation and titration of therapies, advanced monitoring, review of radiographic studies and interpretation of complex data.   Critical Care Time devoted to patient care services, exclusive of separately billable procedures, described in this note is 32 minutes.  Tim Lair, PA-C [facility] 12/12/22 2:50 AM  Please see [email or web address] for pager details.  From 7A-7P if no response, please call [phone] After hours, please call ELink [phone]

## 2022-12-11 NOTE — ED Triage Notes (Signed)
Reports RUQ ABD pain that started last Wed and has continued to worsen, reports some nausea, gas & fever, denies vomiting/diarrhea, no rebound tenderness noted.  Has rotator cuff injury on the same side and has been having some ShoB.

## 2022-12-11 NOTE — ED Provider Notes (Addendum)
[facility] Provider Note   CSN: [id number] Arrival date & time: 12/11/22  1849     History  Chief Complaint  Patient presents with   Abdominal Pain    Carrie Coffey is a 37 y.o. female who presents emergency department chief complaint of abdominal pain.  Patient states that she began having pain in her right upper quadrant and shoulder beginning 1 week ago.  She went to an urgent care on May 30 where she was given a shot of Toradol for her shoulder pain.  She is currently being evaluated by orthopedics for chronic pain in her right shoulder.  Patient states that her pain in her abdomen became worse.  She also is having severe pain in her shoulder and saw her orthopedist yesterday and they are going to have her follow-up with an MRI.  Today her pain became much worse she started running a fever at home.  She states it hurts to take a deep breath due to pain in her abdomen.  She has not had any vomiting.  She has never had any previous surgeries to her abdomen.  She states that the pain is colicky, waxing and waning, severe and persistent.   Abdominal Pain      Home Medications Prior to Admission medications   Medication Sig Start Date End Date Taking? Authorizing Provider  ibuprofen (ADVIL) 600 MG tablet Take 1 tablet (600 mg total) by mouth every 6 (six) hours as needed for cramping. 08/21/21   Essie Hart, MD  senna-docusate (SENOKOT-S) 8.6-50 MG tablet Take 2 tablets by mouth at bedtime as needed for mild constipation. 08/21/21   Essie Hart, MD      Allergies    Apple juice, Food, Pear, Allegra allergy [fexofenadine hcl], and Pseudoephedrine    Review of Systems   Review of Systems  Gastrointestinal:  Positive for abdominal pain.    Physical Exam Updated Vital Signs BP (!) 145/83 (BP Location: Left Arm)   Pulse (!) 126   Temp 99.9 F (37.7 C)   Resp 20   Ht 5\' 1"  (1.549 m)   Wt 70.3 kg   LMP 11/29/2022 (Exact Date)    SpO2 100%   BMI 29.29 kg/m  Physical Exam Vitals and nursing note reviewed.  Constitutional:      General: She is not in acute distress.    Appearance: She is well-developed. She is ill-appearing. She is not diaphoretic.     Comments: Flushed - Hot to the touch  HENT:     Head: Normocephalic and atraumatic.     Right Ear: External ear normal.     Left Ear: External ear normal.     Nose: Nose normal.     Mouth/Throat:     Mouth: Mucous membranes are moist.  Eyes:     General: No scleral icterus.    Conjunctiva/sclera: Conjunctivae normal.  Cardiovascular:     Rate and Rhythm: Regular rhythm. Tachycardia present.     Heart sounds: Normal heart sounds. No murmur heard.    No friction rub. No gallop.  Pulmonary:     Effort: Pulmonary effort is normal. Tachypnea present. No respiratory distress.     Breath sounds: Normal breath sounds.  Abdominal:     General: Bowel sounds are normal. There is no distension.     Palpations: Abdomen is soft. There is no mass.     Tenderness: There is abdominal tenderness in the right upper quadrant. There is guarding.  Positive signs include Murphy's sign.  Musculoskeletal:     Cervical back: Normal range of motion.  Skin:    General: Skin is warm and dry.  Neurological:     Mental Status: She is alert and oriented to person, place, and time.  Psychiatric:        Behavior: Behavior normal.     ED Results / Procedures / Treatments   Labs (all labs ordered are listed, but only abnormal results are displayed) Labs Reviewed  LIPASE, BLOOD  COMPREHENSIVE METABOLIC PANEL  CBC  URINALYSIS, ROUTINE W REFLEX MICROSCOPIC  PREGNANCY, URINE    EKG None  Radiology No results found.  Procedures .Critical Care  Performed by: Arthor Captain, PA-C Authorized by: Arthor Captain, PA-C   Critical care provider statement:    Critical care time (minutes):  60   Critical care time was exclusive of:  Separately billable procedures and treating  other patients   Critical care was necessary to treat or prevent imminent or life-threatening deterioration of the following conditions: multifocal pnumonia, multiple doses of  iv narcotic pain medications.   Critical care was time spent personally by me on the following activities:  Development of treatment plan with patient or surrogate, discussions with consultants, evaluation of patient's response to treatment, examination of patient, ordering and review of laboratory studies, ordering and review of radiographic studies, ordering and performing treatments and interventions, pulse oximetry, re-evaluation of patient's condition and review of old charts     Medications Ordered in ED Medications  fentaNYL (SUBLIMAZE) injection 50 mcg (has no administration in time range)  ondansetron (ZOFRAN) injection 4 mg (has no administration in time range)    ED Course/ Medical Decision Making/ A&P Clinical Course as of 12/11/22 2208  Tue Dec 11, 2022  2012 Urinalysis, Routine w reflex microscopic -Urine, Clean Catch(!) [AH]  2013 CBC(!) [AH]  2013 WBC: 9.6 [AH]  2013 Calcium(!): 7.9 [AH]  2013 Albumin(!): 3.1 [AH]  2013 Pulse Rate(!): 126 [AH]  2131 US ABDOMEN LIMITED RUQ (LIVER/GB) I personally visualized and interpreted the images using our PACS system. Acute findings include:  Poor study due to severe pain and patient intolerance. I have ordered a CT abdomen.  [AH]  2206 CT ABDOMEN PELVIS W CONTRAST I personally visualized and interpreted CT abdomen and pelvis.  Large amount of fluid noted in the right lower lobe of the lung, patchy infiltrates on the left concerning for pneumonia.  I agree with radiologic interpretation.  There is also evidence of an abnormal lesion in the retroareolar space of the right breast.  Given these findings I also have concern for potential pulmonary embolus.  I broadened out the patient's antibiotics with cefepime and vancomycin.  Will proceed with a second contrasted  study of CT angiogram of the chest.  She has good kidney function.  Will increase fluids.  Patient's pain is poorly controlled at this time heart rate is 144 and she is tachypneic.  She is not requiring oxygen at this time.  I suspect the patient will need an admission for these findings.  I have discussed the case with PA Henderly who will assume care with presumed plan for admission for multifocal pneumonia versus pulmonary embolus. [AH]    Clinical Course User Index [AH] Arthor Captain, PA-C                             Medical Decision Making Amount and/or Complexity of Data Reviewed Labs:  ordered. Decision-making details documented in ED Course. Radiology: ordered. Decision-making details documented in ED Course.  Risk Prescription drug management.   This patient presents to the ED with chief complaint(s) of ruq abd pain, shoulder pain with pertinent past medical history of rotator cuff pain which further complicates the presenting complaint. The complaint involves an extensive differential diagnosis and treatment options and also carries with it a high risk of complications and morbidity.    The differential diagnosis includes The emergent DDX for RUQ pain includes but is not limited to Glabladder disease, PUD, Acute Hepatitis, Pancreatitis, pyelonephritis, Pneumonia, Lower lobe PE/Infarct, Kidney stone, GERD, retrocecal appendicitis, Fitz-Hugh-Curtis syndrome, AAA, MI, Zoster.    The initial plan is to order US/RUQ, labs and to treat patient with zosyn, pain/ meds for suspected acute cholecystitis    Additional history obtained: Additional history obtained from family Records reviewed  emr  See ED course for mdm.        Final Clinical Impression(s) / ED Diagnoses Final diagnoses:  None    Rx / DC Orders ED Discharge Orders     None         Arthor Captain, PA-C 12/11/22 2210    Arthor Captain, PA-C 12/11/22 2216    Vanetta Mulders, MD 12/11/22  2321

## 2022-12-11 NOTE — Progress Notes (Signed)
ANTICOAGULATION CONSULT NOTE - Initial Consult  Pharmacy Consult for heparin Indication: pulmonary embolus  Allergies  Allergen Reactions   Apple Juice Hives and Swelling   Food Hives, Swelling and Other (See Comments)    Pt states that she is allergic to strawberries.     Pear Hives and Swelling   Allegra Allergy [Fexofenadine Hcl] Hives   Pseudoephedrine Hives    Patient Measurements: Height: 5\' 1"  (154.9 cm) Weight: 70.3 kg (155 lb) IBW/kg (Calculated) : 47.8 Heparin Dosing Weight: 62.9 kg  Vital Signs: Temp: 98.7 F (37.1 C) (06/04 2210) Temp Source: Oral (06/04 2210) BP: 151/86 (06/04 2210) Pulse Rate: 136 (06/04 2210)  Labs: Recent Labs    12/11/22 1938  HGB 11.3*  HCT 34.9*  PLT 258  CREATININE 0.72    Estimated Creatinine Clearance: 86.3 mL/min (by C-G formula based on SCr of 0.72 mg/dL).   Medical History: Past Medical History:  Diagnosis Date   Anxiety    BV (bacterial vaginosis)    Gestational diabetes    H/O joint problems    Infection    UTI   Psoriasis     Medications:  (Not in a hospital admission)  Scheduled:   heparin  3,700 Units Intravenous Once    Assessment: 61 yoF presented today with abdominal pain. CTA showed bilateral PE with mild right heart strain. Pharmacy has been consulted to dose heparin for PE. Hgb 11.3, plts 258, no signs/symptoms of bleeding, and no PTA anticoagulant.  Goal of Therapy:  Heparin level 0.3-0.7 units/ml Monitor platelets by anticoagulation protocol: Yes   Plan:  Give 3700 units bolus x 1 Start heparin infusion at 1000 units/hr Check anti-Xa level in 6 hours and daily while on heparin Continue to monitor H&H and platelets Monitor signs/symptoms of bleeding  Thanks,  Arabella Merles, PharmD. [facility] Acute Care PGY-1 12/11/2022 10:55 PM

## 2022-12-11 NOTE — ED Notes (Signed)
..ED TO INPATIENT HANDOFF REPORT  ED Nurse Name and Phone #: [phone]  S Name/Age/Gender Carrie Coffey 37 y.o. female Room/Bed: MH06/MH06  Code Status   Code Status: Prior  Home/SNF/Other Home Patient oriented to: self Is this baseline? Yes   Triage Complete: Triage complete  Chief Complaint Pulmonary embolism [facility]) [I26.99]  Triage Note Reports RUQ ABD pain that started last Wed and has continued to worsen, reports some nausea, gas & fever, denies vomiting/diarrhea, no rebound tenderness noted.  Has rotator cuff injury on the same side and has been having some ShoB.   Allergies Allergies  Allergen Reactions   Apple Juice Hives and Swelling   Food Hives, Swelling and Other (See Comments)    Pt states that she is allergic to strawberries.     Pear Hives and Swelling   Allegra Allergy [Fexofenadine Hcl] Hives   Pseudoephedrine Hives    Level of Care/Admitting Diagnosis ED Disposition     ED Disposition  Admit   Condition  --   Comment  Hospital Area: [facility] [[id number]]  Level of Care: ICU [6]  May admit patient to [facility] or [facility] if equivalent level of care is available:: No  Interfacility transfer: Yes  Covid Evaluation: Asymptomatic - no recent exposure (last 10 days) testing not required  Diagnosis: Pulmonary embolism (HCC) [241700]  Admitting Physician: Kalman Shan [id number]  Attending Physician: Kalman Shan [phone]  Certification:: I certify this patient will need inpatient services for at least 2 midnights  Estimated Length of Stay: 3          B Medical/Surgery History Past Medical History:  Diagnosis Date   Anxiety    BV (bacterial vaginosis)    Gestational diabetes    H/O joint problems    Infection    UTI   Psoriasis    Past Surgical History:  Procedure Laterality Date   DILATION AND CURETTAGE OF UTERUS     DILATION AND EVACUATION N/A 08/12/2020   Procedure: DILATATION AND EVACUATION;   Surgeon: Osborn Coho, MD;  Location: [facility] OR;  Service: Gynecology;  Laterality: N/A;   DILATION AND EVACUATION N/A 10/28/2020   Procedure: DILATATION AND EVACUATION;  Surgeon: Hoover Browns, MD;  Location: [facility] OR;  Service: Gynecology;  Laterality: N/A;   LAPAROSCOPY N/A 09/10/2013   Procedure: LAPAROSCOPY OPERATIVE;  Surgeon: Sharon Seller, DO;  Location: [facility] ORS;  Service: Gynecology;  Laterality: N/A;   UNILATERAL SALPINGECTOMY Left 09/10/2013   Procedure: UNILATERAL SALPINGECTOMY;  Surgeon: Sharon Seller, DO;  Location: [facility] ORS;  Service: Gynecology;  Laterality: Left;     A IV Location/Drains/Wounds Patient Lines/Drains/Airways Status     Active Line/Drains/Airways     Name Placement date Placement time Site Days   Peripheral IV 12/11/22 20 G 1" Anterior;Left;Proximal Forearm 12/11/22  1940  Forearm  less than 1   Peripheral IV 12/11/22 20 G 1" Anterior;Left Forearm 12/11/22  2300  Forearm  less than 1   Incision (Closed) 09/10/13 Abdomen 09/10/13  2210  -- 3379   Incision (Closed) 09/10/13 Perineum 09/10/13  2210  -- 3379   Incision (Closed) 08/12/20 Vagina Other (Comment) 08/12/20  1546  -- 851   Incision (Closed) 10/28/20 Perineum Other (Comment) 10/28/20  1802  -- 774   Incision - 3 Ports Abdomen Umbilicus Right Left 09/10/13  2309  -- 3379            Intake/Output Last 24 hours No intake or output data in the 24  hours ending 12/11/22 2347  Labs/Imaging Results for orders placed or performed during the hospital encounter of 12/11/22 (from the past 48 hour(s))  Lipase, blood     Status: None   Collection Time: 12/11/22  7:38 PM  Result Value Ref Range   Lipase 22 11 - 51 U/L    Comment: Performed at [facility], [address] [place], [place] [address]  Comprehensive metabolic panel     Status: Abnormal   Collection Time: 12/11/22  7:38 PM  Result Value Ref Range   Sodium 134 (L) 135 - 145 mmol/L   Potassium 3.6 3.5 - 5.1 mmol/L   Chloride 104 98 - 111  mmol/L   CO2 22 22 - 32 mmol/L   Glucose, Bld 106 (H) 70 - 99 mg/dL    Comment: Glucose reference range applies only to samples taken after fasting for at least 8 hours.   BUN 9 6 - 20 mg/dL   Creatinine, Ser 4.78 0.44 - 1.00 mg/dL   Calcium 7.9 (L) 8.9 - 10.3 mg/dL   Total Protein 8.3 (H) 6.5 - 8.1 g/dL   Albumin 3.1 (L) 3.5 - 5.0 g/dL   AST 20 15 - 41 U/L   ALT 18 0 - 44 U/L   Alkaline Phosphatase 46 38 - 126 U/L   Total Bilirubin 0.7 0.3 - 1.2 mg/dL   GFR, Estimated >29 >56 mL/min    Comment: (NOTE) Calculated using the CKD-EPI Creatinine Equation (2021)    Anion gap 8 5 - 15    Comment: Performed at [facility], [address] [place], [place] [address]  CBC     Status: Abnormal   Collection Time: 12/11/22  7:38 PM  Result Value Ref Range   WBC 9.6 4.0 - 10.5 K/uL   RBC 3.61 (L) 3.87 - 5.11 MIL/uL   Hemoglobin 11.3 (L) 12.0 - 15.0 g/dL   HCT 65.7 (L) 84.6 - 96.2 %   MCV 96.7 80.0 - 100.0 fL   MCH 31.3 26.0 - 34.0 pg   MCHC 32.4 30.0 - 36.0 g/dL   RDW 95.2 84.1 - 32.4 %   Platelets 258 150 - 400 K/uL   nRBC 0.0 0.0 - 0.2 %    Comment: Performed at [facility], [address] [place], [place] [address]  Urinalysis, Routine w reflex microscopic -Urine, Clean Catch     Status: Abnormal   Collection Time: 12/11/22  7:38 PM  Result Value Ref Range   Color, Urine YELLOW YELLOW   APPearance CLOUDY (A) CLEAR   Specific Gravity, Urine 1.020 1.005 - 1.030   pH 7.5 5.0 - 8.0   Glucose, UA NEGATIVE NEGATIVE mg/dL   Hgb urine dipstick TRACE (A) NEGATIVE   Bilirubin Urine NEGATIVE NEGATIVE   Ketones, ur 15 (A) NEGATIVE mg/dL   Protein, ur NEGATIVE NEGATIVE mg/dL   Nitrite NEGATIVE NEGATIVE   Leukocytes,Ua SMALL (A) NEGATIVE    Comment: Performed at [facility], [address] [place], [place] [address]  Pregnancy, urine     Status: None   Collection Time: 12/11/22  7:38 PM  Result Value Ref Range   Preg Test, Ur NEGATIVE NEGATIVE     Comment:        THE SENSITIVITY OF THIS METHODOLOGY IS >20 mIU/mL. Performed at [facility], [address] [place], [place] [address]   Urinalysis, Microscopic (reflex)     Status: Abnormal  Collection Time: 12/11/22  7:38 PM  Result Value Ref Range   RBC / HPF 0-5 0 - 5 RBC/hpf   WBC, UA 0-5 0 - 5 WBC/hpf   Bacteria, UA RARE (A) NONE SEEN   Squamous Epithelial / HPF 0-5 0 - 5 /HPF    Comment: Performed at [facility], [address] [place], [place] [address]  Lactic acid, plasma     Status: None   Collection Time: 12/11/22  8:13 PM  Result Value Ref Range   Lactic Acid, Venous 0.7 0.5 - 1.9 mmol/L    Comment: Performed at [facility], [address] [place], [place] [address]   CT Angio Chest PE W and/or Wo Contrast  Result Date: 12/11/2022 CLINICAL DATA:  Suspected pulmonary embolism. EXAM: CT ANGIOGRAPHY CHEST WITH CONTRAST TECHNIQUE: Multidetector CT imaging of the chest was performed using the standard protocol during bolus administration of intravenous contrast. Multiplanar CT image reconstructions and MIPs were obtained to evaluate the vascular anatomy. RADIATION DOSE REDUCTION: This exam was performed according to the departmental dose-optimization program which includes automated exposure control, adjustment of the mA and/or kV according to patient size and/or use of iterative reconstruction technique. CONTRAST:  60mL OMNIPAQUE IOHEXOL 350 MG/ML SOLN COMPARISON:  None Available. FINDINGS: Cardiovascular: The thoracic aorta is normal in appearance. Satisfactory opacification of the pulmonary arteries to the segmental level. Marked severity areas of intraluminal low attenuation are seen throughout numerous bilateral upper lobe, right middle lobe and bilateral lower lobe branches of the bilateral pulmonary arteries. No saddle embolus is identified. Normal heart size with mild right heart strain (RV/LV ratio of 1.14). No pericardial effusion.  Mediastinum/Nodes: No enlarged mediastinal, hilar, or axillary lymph nodes. Thyroid gland, trachea, and esophagus demonstrate no significant findings. Lungs/Pleura: Marked severity posterior right lower lobe infiltrate is seen. Mild posterior left basilar atelectasis is also noted. A small right pleural effusion is seen. No pneumothorax is identified. Upper Abdomen: No acute abnormality. Musculoskeletal: No chest wall abnormality. No acute or significant osseous findings. Review of the MIP images confirms the above findings. IMPRESSION: 1. Marked severity bilateral pulmonary embolism with mild right heart strain (RV/LV ratio of 1.14). 2. Marked severity posterior right lower lobe infiltrate. 3. Small right pleural effusion. Electronically Signed   By: Aram Candela M.D.   On: 12/11/2022 22:44   CT ABDOMEN PELVIS W CONTRAST  Result Date: 12/11/2022 CLINICAL DATA:  Abdominal pain.  Biliary obstruction suspected. EXAM: CT ABDOMEN AND PELVIS WITH CONTRAST TECHNIQUE: Multidetector CT imaging of the abdomen and pelvis was performed using the standard protocol following bolus administration of intravenous contrast. RADIATION DOSE REDUCTION: This exam was performed according to the departmental dose-optimization program which includes automated exposure control, adjustment of the mA and/or kV according to patient size and/or use of iterative reconstruction technique. CONTRAST:  OMNIPAQUE IOHEXOL 300 MG/ML  SOLN COMPARISON:  12/11/2022. FINDINGS: Lower chest: There is a small right pleural effusion with patchy infiltrates in the lower lobes bilaterally. Hepatobiliary: No focal liver abnormality is seen. No gallstones, gallbladder wall thickening, or biliary dilatation. Pancreas: Unremarkable. No pancreatic ductal dilatation or surrounding inflammatory changes. Spleen: Normal in size without focal abnormality. Adrenals/Urinary Tract: The adrenal glands are within normal limits. The kidneys enhance symmetrically.  No renal calculus or hydronephrosis. The bladder is unremarkable. Stomach/Bowel: There is a small hiatal hernia. Stomach is within normal limits. Appendix appears normal. No evidence of bowel wall thickening, distention, or inflammatory changes. No free air or  pneumatosis. Vascular/Lymphatic: No significant vascular findings are present. No enlarged abdominal or pelvic lymph nodes. Reproductive: Uterus and bilateral adnexa are unremarkable. Other: A trace amount of free fluid is noted in the cul-de-sac which may be physiologic. There is a fat containing periumbilical hernia. Musculoskeletal: There sclerosis at the sacroiliac joints bilaterally, greater on the left than on the right, compatible with sacroiliitis. No acute osseous abnormality. A 2.7 x 1.6 cm lesion is noted in the retroareolar space in the right breast. IMPRESSION: 1. Patchy infiltrates in the lower lobes bilaterally, concerning for pneumonia. 2. Small right pleural effusion. 3. Small hiatal hernia. 4. 2.7 x 1.6 cm lesion in the retroareolar space in the right breast. Diagnostic ultrasound is recommended for further evaluation on non emergent follow-up. Electronically Signed   By: Thornell Sartorius M.D.   On: 12/11/2022 21:50   US ABDOMEN LIMITED RUQ (LIVER/GB)  Result Date: 12/11/2022 CLINICAL DATA:  Right upper quadrant pain and nausea x6 days. EXAM: ULTRASOUND ABDOMEN LIMITED RIGHT UPPER QUADRANT COMPARISON:  None Available. FINDINGS: Gallbladder: No gallstones or wall thickening visualized (2.3 mm). No sonographic Murphy sign noted by sonographer. Common bile duct: Diameter: 3.8 mm Liver: No focal lesion identified. Within normal limits in parenchymal echogenicity. Portal vein is patent on color Doppler imaging with normal direction of blood flow towards the liver. Other: The study is markedly limited secondary to severe patient pain with probe pressure at every location. IMPRESSION: Markedly limited study, as described above, without evidence of  cholelithiasis or acute cholecystitis. Electronically Signed   By: Aram Candela M.D.   On: 12/11/2022 20:58    Pending Labs Unresulted Labs (From admission, onward)     Start     Ordered   12/13/22 0500  Heparin level (unfractionated)  Daily,   R      12/11/22 2258   12/12/22 0500  Heparin level (unfractionated)  Once-Timed,   URGENT        12/11/22 2258   12/11/22 2318  Phosphorus  Once,   STAT        12/11/22 2317   12/11/22 2317  Respiratory (~20 pathogens) panel by PCR  (Respiratory panel by PCR (~20 pathogens, ~24 hr TAT)  w precautions)  Once,   URGENT        12/11/22 2316   12/11/22 2317  Legionella Pneumophila Serogp 1 Ur Ag  Once,   URGENT        12/11/22 2316   12/11/22 2317  Strep pneumoniae urinary antigen  Once,   URGENT        12/11/22 2316   12/11/22 2317  Procalcitonin  Once,   URGENT       References:    Procalcitonin Lower Respiratory Tract Infection AND Sepsis Procalcitonin Algorithm   12/11/22 2316   12/11/22 2317  Magnesium  Once,   STAT        12/11/22 2317   12/11/22 2315  Brain natriuretic peptide  Once,   URGENT        12/11/22 2314   12/11/22 2159  Blood culture (routine x 2)  BLOOD CULTURE X 2,   STAT     Question Answer Comment  Patient immune status Normal   Release to patient Immediate      12/11/22 2158   12/11/22 2009  Lactic acid, plasma  Now then every 2 hours,   R (with STAT occurrences)      12/11/22 2009            Vitals/Pain  Today's Vitals   12/11/22 2210 12/11/22 2301 12/11/22 2318 12/11/22 2331  BP: (!) 151/86  (!) 145/85   Pulse: (!) 136  (!) 131 (!) 133  Resp: (!) 25  (!) 27 (!) 30  Temp: 98.7 F (37.1 C)  98.5 F (36.9 C)   TempSrc: Oral     SpO2: 96%  97% 96%  Weight:      Height:      PainSc:  5       Isolation Precautions Droplet precaution  Medications Medications  piperacillin-tazobactam (ZOSYN) IVPB 3.375 g (has no administration in time range)  vancomycin (VANCOCIN) IVPB 1000 mg/200 mL premix (1,000  mg Intravenous New Bag/Given 12/11/22 2328)    Followed by  vancomycin (VANCOCIN) IVPB 1000 mg/200 mL premix (has no administration in time range)  vancomycin (VANCOCIN) IVPB 1000 mg/200 mL premix (has no administration in time range)    And  vancomycin (VANCOCIN) 500 mg in sodium chloride 0.9 % 100 mL IVPB (has no administration in time range)  heparin ADULT infusion 100 units/mL (25000 units/221mL) (1,000 Units/hr Intravenous New Bag/Given 12/11/22 2316)  fentaNYL (SUBLIMAZE) injection 50 mcg (50 mcg Intravenous Given 12/11/22 1946)  ondansetron (ZOFRAN) injection 4 mg (4 mg Intravenous Given 12/11/22 1945)  sodium chloride 0.9 % bolus 1,000 mL (0 mLs Intravenous Stopped 12/11/22 2151)  piperacillin-tazobactam (ZOSYN) IVPB 3.375 g (0 g Intravenous Stopped 12/11/22 2037)  HYDROmorphone (DILAUDID) injection 0.5 mg (0.5 mg Intravenous Given 12/11/22 2145)  iohexol (OMNIPAQUE) 300 MG/ML solution 100 mL (100 mLs Intravenous Contrast Given 12/11/22 2118)  fentaNYL (SUBLIMAZE) injection 50 mcg (50 mcg Intravenous Given 12/11/22 2212)  iohexol (OMNIPAQUE) 350 MG/ML injection 60 mL (60 mLs Intravenous Contrast Given 12/11/22 2220)  sodium chloride 0.9 % bolus 1,000 mL (1,000 mLs Intravenous New Bag/Given 12/11/22 2249)  heparin bolus via infusion 3,700 Units (3,700 Units Intravenous Bolus from Bag 12/11/22 2316)    Mobility walks     Focused Assessments Pulmonary Assessment Handoff:  Lung sounds: Bilateral Breath Sounds: Diminished, Clear O2 Device: Nasal Cannula O2 Flow Rate (L/min): 2 L/min    R Recommendations: See Admitting Provider Note  Report given to:   Additional Notes: pt has Pneumonia and multiple Pe's will be admitted to treat both, HR has been around 130 and as high as 155. Came down to 122 with fluids.

## 2022-12-11 NOTE — H&P (Incomplete)
NAME:  Carrie Coffey, MRN:  [id number], DOB:  [birth year], LOS: 0 ADMISSION DATE:  12/11/2022 CONSULTATION DATE:  12/11/2022 REFERRING MD:  Deretha Emory - EDP CHIEF COMPLAINT:  PE   History of Present Illness:  37 year old woman who presented to [facility] 6/4 for ***. PMHx significant for anxiety, prior UTI, psoriasis.    Pertinent Medical History:   Past Medical History:  Diagnosis Date  . Anxiety   . BV (bacterial vaginosis)   . Gestational diabetes   . H/O joint problems   . Infection    UTI  . Psoriasis    Significant Hospital Events: Including procedures, antibiotic start and stop dates in addition to other pertinent events     Interim History / Subjective:  ***  Objective:  Blood pressure (!) 145/85, pulse (!) 133, temperature 98.5 F (36.9 C), resp. rate (!) 30, height 5\' 1"  (1.549 m), weight 70.3 kg, last menstrual period 11/29/2022, SpO2 96 %, unknown if currently breastfeeding.    FiO2 (%):  [100 %] 100 %  No intake or output data in the 24 hours ending 12/11/22 2359 Filed Weights   12/11/22 1906  Weight: 70.3 kg   Physical Examination: General: {SRACUITY:25313} ill-appearing *** in NAD. HEENT: [place]/AT, anicteric sclera, PERRL, moist mucous membranes. Neuro: {SRLOC:25308} {SRSTIMULI:25309} {SRCOMMANDS:25310} {SRNEUROEXTREMITIES:25312} Strength ***/5 in *** extremities. {SRRBRAINSTEM:25311}  CV: RRR, no m/g/r. PULM: Breathing even and unlabored on ***. Lung fields ***. GI: Soft, nontender, nondistended. Normoactive bowel sounds. Extremities: *** LE edema noted. Skin: Warm/dry, ***.  Resolved Hospital Problem List:    Assessment & Plan:  ***  Best Practice: (right click and "Reselect all SmartList Selections" daily)   Diet/type: {diet type:25684} DVT prophylaxis: {anticoagulation (Optional):25687} GI prophylaxis: {NW:29562} Lines: {Central Venous Access:25771} Foley:  {Central Venous Access:25691} Code Status:  {Code Status:26939} Last date of  multidisciplinary goals of care discussion [***]  Labs:  CBC: Recent Labs  Lab 12/11/22 1938  WBC 9.6  HGB 11.3*  HCT 34.9*  MCV 96.7  PLT 258   Basic Metabolic Panel: Recent Labs  Lab 12/11/22 1938  NA 134*  K 3.6  CL 104  CO2 22  GLUCOSE 106*  BUN 9  CREATININE 0.72  CALCIUM 7.9*   GFR: Estimated Creatinine Clearance: 86.3 mL/min (by C-G formula based on SCr of 0.72 mg/dL). Recent Labs  Lab 12/11/22 1938 12/11/22 2013  WBC 9.6  --   LATICACIDVEN  --  0.7   Liver Function Tests: Recent Labs  Lab 12/11/22 1938  AST 20  ALT 18  ALKPHOS 46  BILITOT 0.7  PROT 8.3*  ALBUMIN 3.1*   Recent Labs  Lab 12/11/22 1938  LIPASE 22   No results for input(s): "AMMONIA" in the last 168 hours.  ABG: No results found for: "PHART", "PCO2ART", "PO2ART", "HCO3", "TCO2", "ACIDBASEDEF", "O2SAT"   Coagulation Profile: No results for input(s): "INR", "PROTIME" in the last 168 hours.  Cardiac Enzymes: No results for input(s): "CKTOTAL", "CKMB", "CKMBINDEX", "TROPONINI" in the last 168 hours.  HbA1C: No results found for: "HGBA1C"  CBG: No results for input(s): "GLUCAP" in the last 168 hours.  Review of Systems:   ***  Past Medical History:  She,  has a past medical history of Anxiety, BV (bacterial vaginosis), Gestational diabetes, H/O joint problems, Infection, and Psoriasis.   Surgical History:   Past Surgical History:  Procedure Laterality Date  . DILATION AND CURETTAGE OF UTERUS    . DILATION AND EVACUATION N/A 08/12/2020   Procedure: DILATATION AND EVACUATION;  Surgeon:  Osborn Coho, MD;  Location: [facility] OR;  Service: Gynecology;  Laterality: N/A;  . DILATION AND EVACUATION N/A 10/28/2020   Procedure: DILATATION AND EVACUATION;  Surgeon: Hoover Browns, MD;  Location: [facility] OR;  Service: Gynecology;  Laterality: N/A;  . LAPAROSCOPY N/A 09/10/2013   Procedure: LAPAROSCOPY OPERATIVE;  Surgeon: Sharon Seller, DO;  Location: [facility] ORS;  Service: Gynecology;  Laterality:  N/A;  . UNILATERAL SALPINGECTOMY Left 09/10/2013   Procedure: UNILATERAL SALPINGECTOMY;  Surgeon: Sharon Seller, DO;  Location: [facility] ORS;  Service: Gynecology;  Laterality: Left;   Social History:   reports that she has never smoked. She has never used smokeless tobacco. She reports that she does not currently use alcohol after a past usage of about 1.0 standard drink of alcohol per week. She reports that she does not use drugs.   Family History:  Her family history is negative for Asthma, Cancer, Diabetes, Hearing loss, Heart disease, Hypertension, and Stroke.   Allergies: Allergies  Allergen Reactions  . Apple Juice Hives and Swelling  . Food Hives, Swelling and Other (See Comments)    Pt states that she is allergic to strawberries.    . Pear Hives and Swelling  . Allegra Allergy [Fexofenadine Hcl] Hives  . Pseudoephedrine Hives   Home Medications: Prior to Admission medications   Medication Sig Start Date End Date Taking? Authorizing Provider  ibuprofen (ADVIL) 600 MG tablet Take 1 tablet (600 mg total) by mouth every 6 (six) hours as needed for cramping. 08/21/21   Essie Hart, MD  senna-docusate (SENOKOT-S) 8.6-50 MG tablet Take 2 tablets by mouth at bedtime as needed for mild constipation. 08/21/21   Essie Hart, MD   Critical care time:    The patient is critically ill with multiple organ system failure and requires high complexity decision making for assessment and support, frequent evaluation and titration of therapies, advanced monitoring, review of radiographic studies and interpretation of complex data.   Critical Care Time devoted to patient care services, exclusive of separately billable procedures, described in this note is *** minutes.  Tim Lair, PA-C [facility] 12/11/22 11:59 PM  Please see [email or web address] for pager details.  From 7A-7P if no response, please call [phone] After hours, please call ELink [phone]

## 2022-12-11 NOTE — ED Provider Notes (Signed)
Care assumed from previous provider at shift change.  See note for full HPI.  In [place] 37 year old, here for abd pain.  Seen a week ago by urgent care for thoracic back pain and shoulder pain.  Had worsening pain today.  Had gotten an abdominal pain workup was started antibiotics for possible cholecystitis however CT scan showed pneumonia.  Still persistently tachycardic, tachypneic, splinting.  CTA ordered to rule out additional pathology plan on follow-up Physical Exam  BP (!) 145/85   Pulse (!) 131   Temp 98.5 F (36.9 C)   Resp (!) 27   Ht 5\' 1"  (1.549 m)   Wt 70.3 kg   LMP 11/29/2022 (Exact Date)   SpO2 97%   BMI 29.29 kg/m   Physical Exam Vitals and nursing note reviewed.  Constitutional:      General: She is not in acute distress.    Appearance: She is well-developed. She is not ill-appearing.  HENT:     Head: Atraumatic.  Eyes:     Pupils: Pupils are equal, round, and reactive to light.  Cardiovascular:     Rate and Rhythm: Tachycardia present.     Pulses: Normal pulses.          Radial pulses are 2+ on the right side and 2+ on the left side.       Dorsalis pedis pulses are 2+ on the right side and 2+ on the left side.     Heart sounds: Normal heart sounds.  Pulmonary:     Effort: Pulmonary effort is normal. Tachypnea present. No respiratory distress.     Breath sounds: Normal breath sounds and air entry.  Abdominal:     General: There is no distension.  Musculoskeletal:        General: Normal range of motion.     Cervical back: Normal range of motion.  Skin:    General: Skin is warm and dry.  Neurological:     General: No focal deficit present.     Mental Status: She is alert.  Psychiatric:        Mood and Affect: Mood normal.     Procedures  .Critical Care  Performed by: Linwood Dibbles, PA-C Authorized by: Linwood Dibbles, PA-C   Critical care provider statement:    Critical care time (minutes):  36   Critical care was necessary to treat  or prevent imminent or life-threatening deterioration of the following conditions:  Circulatory failure and cardiac failure   ED Course / MDM   Clinical Course as of 12/11/22 2327  Tue Dec 11, 2022  2012 Urinalysis, Routine w reflex microscopic -Urine, Clean Catch(!) [AH]  2013 CBC(!) [AH]  2013 WBC: 9.6 [AH]  2013 Calcium(!): 7.9 [AH]  2013 Albumin(!): 3.1 [AH]  2013 Pulse Rate(!): 126 [AH]  2131 US ABDOMEN LIMITED RUQ (LIVER/GB) I personally visualized and interpreted the images using our PACS system. Acute findings include:  Poor study due to severe pain and patient intolerance. I have ordered a CT abdomen.  [AH]  2206 CT ABDOMEN PELVIS W CONTRAST I personally visualized and interpreted CT abdomen and pelvis.  Large amount of fluid noted in the right lower lobe of the lung, patchy infiltrates on the left concerning for pneumonia.  I agree with radiologic interpretation.  There is also evidence of an abnormal lesion in the retroareolar space of the right breast.  Given these findings I also have concern for potential pulmonary embolus.  I broadened out the patient's antibiotics with cefepime  and vancomycin.  Will proceed with a second contrasted study of CT angiogram of the chest.  She has good kidney function.  Will increase fluids.  Patient's pain is poorly controlled at this time heart rate is 144 and she is tachypneic.  She is not requiring oxygen at this time.  I suspect the patient will need an admission for these findings.  I have discussed the case with PA Weylyn Ricciuti who will assume care with presumed plan for admission for multifocal pneumonia versus pulmonary embolus. [AH]    Clinical Course User Index [AH] Arthor Captain, PA-C   Medical Decision Making Amount and/or Complexity of Data Reviewed External Data Reviewed: labs, radiology, ECG and notes. Labs: ordered. Decision-making details documented in ED Course. Radiology: ordered and independent interpretation performed.  Decision-making details documented in ED Course. ECG/medicine tests: ordered and independent interpretation performed. Decision-making details documented in ED Course.  Risk OTC drugs. Prescription drug management. Parenteral controlled substances. Decision regarding hospitalization. Diagnosis or treatment significantly limited by social determinants of health.   Care assumed from previous provider at shift change.  See note for full HPI.  In [place] 37 year old, here for abd pain.  Seen a week ago by urgent care for thoracic back pain and shoulder pain.  Had worsening pain today.  Had gotten an abdominal pain workup was started antibiotics for possible cholecystitis however CT scan showed pneumonia.  Still persistently tachycardic, tachypneic, splinting.  CTA ordered to rule out additional pathology plan on follow-up   Labs personally viewed and interpreted  CTA shows bilateral PE with heart strain as well as pneumonia.  I discussed results with patient.  She has no risk factors for PE however her CT scan of her abdomen did show nodule right and right breast recommending mammogram.  No clinical evidence of VTE on exam.  Patient reassessed.  Still persistently tachycardic, tachypneic.  Blood pressure stable.  She will be started on heparin.  Patient reassessed.  I discussed her labs and imaging.  She has no bleeding risk factors, nursing starting second IV on her.  Will touch base with critical care  Discussed with Dr. Marchelle Gearing with critical care.  Agrees to admit for further workup and management of pulmonary embolism as well as pneumonia.  Patient and family agreeable.  The patient appears reasonably stabilized for admission considering the current resources, flow, and capabilities available in the ED at this time, and I doubt any other [facility] requiring further screening and/or treatment in the ED prior to admission.     Saarah Dewing A, PA-C 12/11/22 2327    Vanetta Mulders, MD 12/12/22 1515

## 2022-12-11 NOTE — Progress Notes (Signed)
PCCM transfer request    Sending physician: PA for Dr Deretha Emory,  Lowanda Foster  Sending facility: [facility]  Reason for transfer:  Brief case summary: 22 female. Seen last wek for urgent care for Rt sided chest pain > sent home with costochodrintis. Presente 12/11/2022  C/o abdominaal pain 12/11/2022  with 99.9 And HR 126. RUQ tenderness +. [place] abd normal. CTA -> biltaeral PE with pneumonia.     MEWS 4 RR 36 Pulse ox 96% on RA HR 145 BP 151/86     12/11/2022   10:10 PM 12/11/2022    8:30 PM 12/11/2022    7:06 PM  Vitals with BMI  Height   5\' 1"   Weight   155 lbs  BMI   29.3  Systolic 151 136   Diastolic 86 89   Pulse 136 108    Lactiate normal Troponin - PENDING BNP -   CTA - RV LV 1.14 and UP but NOT SADDLE PE   LABS    PULMONARY No results for input(s): "PHART", "PCO2ART", "PO2ART", "HCO3", "TCO2", "O2SAT" in the last 168 hours.  Invalid input(s): "PCO2", "PO2"  CBC Recent Labs  Lab 12/11/22 1938  HGB 11.3*  HCT 34.9*  WBC 9.6  PLT 258    COAGULATION No results for input(s): "INR" in the last 168 hours.  CARDIAC  No results for input(s): "TROPONINI" in the last 168 hours. No results for input(s): "PROBNP" in the last 168 hours.   CHEMISTRY Recent Labs  Lab 12/11/22 1938  NA 134*  K 3.6  CL 104  CO2 22  GLUCOSE 106*  BUN 9  CREATININE 0.72  CALCIUM 7.9*   Estimated Creatinine Clearance: 86.3 mL/min (by C-G formula based on SCr of 0.72 mg/dL).   LIVER Recent Labs  Lab 12/11/22 1938  AST 20  ALT 18  ALKPHOS 46  BILITOT 0.7  PROT 8.3*  ALBUMIN 3.1*     INFECTIOUS Recent Labs  Lab 12/11/22 2013  LATICACIDVEN 0.7     ENDOCRINE CBG (last 3)  No results for input(s): "GLUCAP" in the last 72 hours.       IMAGING x48h  - image(s) personally visualized  -   highlighted in bold CT Angio Chest PE W and/or Wo Contrast  Result Date: 12/11/2022 CLINICAL DATA:  Suspected pulmonary embolism. EXAM: CT ANGIOGRAPHY CHEST WITH CONTRAST  TECHNIQUE: Multidetector CT imaging of the chest was performed using the standard protocol during bolus administration of intravenous contrast. Multiplanar CT image reconstructions and MIPs were obtained to evaluate the vascular anatomy. RADIATION DOSE REDUCTION: This exam was performed according to the departmental dose-optimization program which includes automated exposure control, adjustment of the mA and/or kV according to patient size and/or use of iterative reconstruction technique. CONTRAST:  60mL OMNIPAQUE IOHEXOL 350 MG/ML SOLN COMPARISON:  None Available. FINDINGS: Cardiovascular: The thoracic aorta is normal in appearance. Satisfactory opacification of the pulmonary arteries to the segmental level. Marked severity areas of intraluminal low attenuation are seen throughout numerous bilateral upper lobe, right middle lobe and bilateral lower lobe branches of the bilateral pulmonary arteries. No saddle embolus is identified. Normal heart size with mild right heart strain (RV/LV ratio of 1.14). No pericardial effusion. Mediastinum/Nodes: No enlarged mediastinal, hilar, or axillary lymph nodes. Thyroid gland, trachea, and esophagus demonstrate no significant findings. Lungs/Pleura: Marked severity posterior right lower lobe infiltrate is seen. Mild posterior left basilar atelectasis is also noted. A small right pleural effusion is seen. No pneumothorax is identified. Upper Abdomen: No acute  abnormality. Musculoskeletal: No chest wall abnormality. No acute or significant osseous findings. Review of the MIP images confirms the above findings. IMPRESSION: 1. Marked severity bilateral pulmonary embolism with mild right heart strain (RV/LV ratio of 1.14). 2. Marked severity posterior right lower lobe infiltrate. 3. Small right pleural effusion. Electronically Signed   By: Aram Candela M.D.   On: 12/11/2022 22:44   CT ABDOMEN PELVIS W CONTRAST  Result Date: 12/11/2022 CLINICAL DATA:  Abdominal pain.  Biliary  obstruction suspected. EXAM: CT ABDOMEN AND PELVIS WITH CONTRAST TECHNIQUE: Multidetector CT imaging of the abdomen and pelvis was performed using the standard protocol following bolus administration of intravenous contrast. RADIATION DOSE REDUCTION: This exam was performed according to the departmental dose-optimization program which includes automated exposure control, adjustment of the mA and/or kV according to patient size and/or use of iterative reconstruction technique. CONTRAST:  OMNIPAQUE IOHEXOL 300 MG/ML  SOLN COMPARISON:  12/11/2022. FINDINGS: Lower chest: There is a small right pleural effusion with patchy infiltrates in the lower lobes bilaterally. Hepatobiliary: No focal liver abnormality is seen. No gallstones, gallbladder wall thickening, or biliary dilatation. Pancreas: Unremarkable. No pancreatic ductal dilatation or surrounding inflammatory changes. Spleen: Normal in size without focal abnormality. Adrenals/Urinary Tract: The adrenal glands are within normal limits. The kidneys enhance symmetrically. No renal calculus or hydronephrosis. The bladder is unremarkable. Stomach/Bowel: There is a small hiatal hernia. Stomach is within normal limits. Appendix appears normal. No evidence of bowel wall thickening, distention, or inflammatory changes. No free air or pneumatosis. Vascular/Lymphatic: No significant vascular findings are present. No enlarged abdominal or pelvic lymph nodes. Reproductive: Uterus and bilateral adnexa are unremarkable. Other: A trace amount of free fluid is noted in the cul-de-sac which may be physiologic. There is a fat containing periumbilical hernia. Musculoskeletal: There sclerosis at the sacroiliac joints bilaterally, greater on the left than on the right, compatible with sacroiliitis. No acute osseous abnormality. A 2.7 x 1.6 cm lesion is noted in the retroareolar space in the right breast. IMPRESSION: 1. Patchy infiltrates in the lower lobes bilaterally, concerning  for pneumonia. 2. Small right pleural effusion. 3. Small hiatal hernia. 4. 2.7 x 1.6 cm lesion in the retroareolar space in the right breast. Diagnostic ultrasound is recommended for further evaluation on non emergent follow-up. Electronically Signed   By: Thornell Sartorius M.D.   On: 12/11/2022 21:50   US ABDOMEN LIMITED RUQ (LIVER/GB)  Result Date: 12/11/2022 CLINICAL DATA:  Right upper quadrant pain and nausea x6 days. EXAM: ULTRASOUND ABDOMEN LIMITED RIGHT UPPER QUADRANT COMPARISON:  None Available. FINDINGS: Gallbladder: No gallstones or wall thickening visualized (2.3 mm). No sonographic Murphy sign noted by sonographer. Common bile duct: Diameter: 3.8 mm Liver: No focal lesion identified. Within normal limits in parenchymal echogenicity. Portal vein is patent on color Doppler imaging with normal direction of blood flow towards the liver. Other: The study is markedly limited secondary to severe patient pain with probe pressure at every location. IMPRESSION: Markedly limited study, as described above, without evidence of cholelithiasis or acute cholecystitis. Electronically Signed   By: Aram Candela M.D.   On: 12/11/2022 20:58      Recommendations made prior to transfer:  Transfer accepted: yes/no YES  A Submassive PE PESI 3 TAchyepnia - due to pleuritic pain  Plan  - IV hjeparin gtt  -check trop, bnp -check echo on arrival - TPA for rescue Rx (doubt thrombectomy candidate as clot distal)  - check RVP, urine strepo, urine leg, pct - empiric CAP covdrage  advise   Accept to [place] [facility] - if stable can go to [facility] in 24h      SIGNATURE    Dr. Kalman Shan, M.D., F.C.C.P,  Pulmonary and Critical Care Medicine Staff Physician, [facility] Center Director - Interstitial Lung Disease  Program  Pulmonary Fibrosis [facility] at [facility] [place], [place], [address]   Pager: [phone], If no answer  -> Check AMION or Try [phone] Telephone (clinical office): [phone] Telephone (research): [phone]  11:19 PM 12/11/2022     Kalman Shan 12/11/22 11:11 PM [facility]  For contact information, see Amion. If no response to pager, please call PCCM consult pager. After hours, 7PM- 7AM, please call Elink.

## 2022-12-11 NOTE — ED Notes (Signed)
[facility] called for transport @23 :33

## 2022-12-12 ENCOUNTER — Inpatient Hospital Stay (HOSPITAL_COMMUNITY): Payer: BC Managed Care – PPO

## 2022-12-12 DIAGNOSIS — Z888 Allergy status to other drugs, medicaments and biological substances status: Secondary | ICD-10-CM | POA: Diagnosis not present

## 2022-12-12 DIAGNOSIS — G8929 Other chronic pain: Secondary | ICD-10-CM | POA: Diagnosis present

## 2022-12-12 DIAGNOSIS — I2609 Other pulmonary embolism with acute cor pulmonale: Secondary | ICD-10-CM

## 2022-12-12 DIAGNOSIS — J9601 Acute respiratory failure with hypoxia: Secondary | ICD-10-CM | POA: Diagnosis present

## 2022-12-12 DIAGNOSIS — Z91018 Allergy to other foods: Secondary | ICD-10-CM | POA: Diagnosis not present

## 2022-12-12 DIAGNOSIS — Z86711 Personal history of pulmonary embolism: Secondary | ICD-10-CM

## 2022-12-12 DIAGNOSIS — J189 Pneumonia, unspecified organism: Secondary | ICD-10-CM | POA: Diagnosis present

## 2022-12-12 DIAGNOSIS — I2694 Multiple subsegmental pulmonary emboli without acute cor pulmonale: Secondary | ICD-10-CM | POA: Diagnosis not present

## 2022-12-12 DIAGNOSIS — F419 Anxiety disorder, unspecified: Secondary | ICD-10-CM | POA: Diagnosis present

## 2022-12-12 DIAGNOSIS — I2699 Other pulmonary embolism without acute cor pulmonale: Secondary | ICD-10-CM | POA: Diagnosis present

## 2022-12-12 DIAGNOSIS — L409 Psoriasis, unspecified: Secondary | ICD-10-CM | POA: Diagnosis present

## 2022-12-12 DIAGNOSIS — J90 Pleural effusion, not elsewhere classified: Secondary | ICD-10-CM | POA: Diagnosis present

## 2022-12-12 LAB — CBC
HCT: 31.2 % — ABNORMAL LOW (ref 36.0–46.0)
Hemoglobin: 10.2 g/dL — ABNORMAL LOW (ref 12.0–15.0)
MCH: 31.5 pg (ref 26.0–34.0)
MCHC: 32.7 g/dL (ref 30.0–36.0)
MCV: 96.3 fL (ref 80.0–100.0)
Platelets: 234 10*3/uL (ref 150–400)
RBC: 3.24 MIL/uL — ABNORMAL LOW (ref 3.87–5.11)
RDW: 13.2 % (ref 11.5–15.5)
WBC: 9.8 10*3/uL (ref 4.0–10.5)
nRBC: 0 % (ref 0.0–0.2)

## 2022-12-12 LAB — RESPIRATORY PANEL BY PCR

## 2022-12-12 LAB — BASIC METABOLIC PANEL
Anion gap: 11 (ref 5–15)
BUN: <5 mg/dL — ABNORMAL LOW (ref 6–20)
CO2: 20 mmol/L — ABNORMAL LOW (ref 22–32)
Calcium: 7.5 mg/dL — ABNORMAL LOW (ref 8.9–10.3)
Chloride: 103 mmol/L (ref 98–111)
Creatinine, Ser: 0.66 mg/dL (ref 0.44–1.00)
GFR, Estimated: >60 mL/min (ref >60)
Glucose, Bld: 158 mg/dL — ABNORMAL HIGH (ref 70–99)
Potassium: 3.4 mmol/L — ABNORMAL LOW (ref 3.5–5.1)
Sodium: 134 mmol/L — ABNORMAL LOW (ref 135–145)

## 2022-12-12 LAB — TROPONIN I (HIGH SENSITIVITY): Troponin I (High Sensitivity): 38 ng/L — ABNORMAL HIGH (ref <18)

## 2022-12-12 LAB — ECHOCARDIOGRAM COMPLETE
AR max vel: 2.11 cm2
AV Area VTI: 2.42 cm2
AV Area mean vel: 2.27 cm2
AV Mean grad: 4 mmHg
AV Peak grad: 7.5 mmHg
Ao pk vel: 1.37 m/s
Area-P 1/2: 4.21 cm2
Calc EF: 61.3 %
Height: 61 in
S' Lateral: 2.7 cm
Single Plane A2C EF: 56.9 %
Single Plane A4C EF: 63.4 %
Weight: 2659.63 oz

## 2022-12-12 LAB — CULTURE, BLOOD (ROUTINE X 2): Culture: NO GROWTH

## 2022-12-12 LAB — STREP PNEUMONIAE URINARY ANTIGEN: Strep Pneumo Urinary Antigen: NEGATIVE

## 2022-12-12 LAB — HEPARIN LEVEL (UNFRACTIONATED)
Heparin Unfractionated: 0.1 IU/mL — ABNORMAL LOW (ref 0.30–0.70)
Heparin Unfractionated: 0.23 IU/mL — ABNORMAL LOW (ref 0.30–0.70)

## 2022-12-12 LAB — MRSA NEXT GEN BY PCR, NASAL: MRSA by PCR Next Gen: NOT DETECTED

## 2022-12-12 LAB — LACTIC ACID, PLASMA: Lactic Acid, Venous: 1 mmol/L (ref 0.5–1.9)

## 2022-12-12 LAB — PROCALCITONIN: Procalcitonin: <0.1 ng/mL

## 2022-12-12 LAB — MAGNESIUM: Magnesium: 2 mg/dL (ref 1.7–2.4)

## 2022-12-12 LAB — PHOSPHORUS: Phosphorus: 2.7 mg/dL (ref 2.5–4.6)

## 2022-12-12 LAB — BRAIN NATRIURETIC PEPTIDE: B Natriuretic Peptide: 60.4 pg/mL (ref 0.0–100.0)

## 2022-12-12 LAB — GLUCOSE, CAPILLARY: Glucose-Capillary: 149 mg/dL — ABNORMAL HIGH (ref 70–99)

## 2022-12-12 MED ORDER — POTASSIUM CHLORIDE 20 MEQ PO PACK: 40.0000 meq | PACK | Freq: Once | ORAL | Status: DC

## 2022-12-12 MED ORDER — POTASSIUM CHLORIDE CRYS ER 20 MEQ PO TBCR
40.0000 meq | EXTENDED_RELEASE_TABLET | Freq: Once | ORAL | Status: AC
  Administered 2022-12-12: 40 meq via ORAL
  Filled 2022-12-12: qty 2

## 2022-12-12 MED ORDER — HEPARIN BOLUS VIA INFUSION
2000.0000 [IU] | Freq: Once | INTRAVENOUS | Status: AC
  Administered 2022-12-12: 2000 [IU] via INTRAVENOUS
  Filled 2022-12-12: qty 2000

## 2022-12-12 MED ORDER — ORAL CARE MOUTH RINSE: 15.0000 mL | OROMUCOSAL | Status: DC | PRN

## 2022-12-12 MED ORDER — SODIUM CHLORIDE 0.9 % IV SOLN: INTRAVENOUS | Status: DC | PRN

## 2022-12-12 MED ORDER — ACETAMINOPHEN 325 MG PO TABS
650.0000 mg | ORAL_TABLET | ORAL | Status: AC | PRN
  Administered 2022-12-12 – 2022-12-14 (×3): 650 mg via ORAL
  Filled 2022-12-12 (×3): qty 2

## 2022-12-12 MED ORDER — POLYETHYLENE GLYCOL 3350 17 G PO PACK
17.0000 g | PACK | Freq: Every day | ORAL | Status: DC | PRN
  Administered 2022-12-13: 17 g via ORAL
  Filled 2022-12-12: qty 1

## 2022-12-12 MED ORDER — LORAZEPAM 1 MG PO TABS: 1.0000 mg | ORAL_TABLET | Freq: Once | ORAL | Status: DC

## 2022-12-12 MED ORDER — DOCUSATE SODIUM 100 MG PO CAPS
100.0000 mg | ORAL_CAPSULE | Freq: Two times a day (BID) | ORAL | Status: DC | PRN
  Administered 2022-12-13: 100 mg via ORAL
  Filled 2022-12-12: qty 1

## 2022-12-12 MED ORDER — HEPARIN BOLUS VIA INFUSION
900.0000 [IU] | Freq: Once | INTRAVENOUS | Status: AC
  Administered 2022-12-12: 900 [IU] via INTRAVENOUS
  Filled 2022-12-12: qty 900

## 2022-12-12 MED ORDER — ALPRAZOLAM 0.5 MG PO TABS
0.2500 mg | ORAL_TABLET | Freq: Once | ORAL | Status: AC
  Administered 2022-12-12: 0.25 mg via ORAL
  Filled 2022-12-12: qty 1

## 2022-12-12 MED ORDER — VANCOMYCIN HCL 1500 MG/300ML IV SOLN: 1500.0000 mg | INTRAVENOUS | Status: DC

## 2022-12-12 MED ORDER — CHLORHEXIDINE GLUCONATE CLOTH 2 % EX PADS
6.0000 | MEDICATED_PAD | Freq: Every day | CUTANEOUS | Status: DC
  Administered 2022-12-12 – 2022-12-15 (×3): 6 via TOPICAL

## 2022-12-12 MED ORDER — OXYCODONE HCL 5 MG PO TABS
5.0000 mg | ORAL_TABLET | ORAL | Status: DC | PRN
  Administered 2022-12-12 – 2022-12-14 (×10): 5 mg via ORAL
  Filled 2022-12-12 (×10): qty 1

## 2022-12-12 NOTE — Progress Notes (Signed)
Lower extremity venous bilateral study completed.   Please see CV Proc for preliminary results.   Lorien Shingler, RDMS, RVT  

## 2022-12-12 NOTE — Progress Notes (Signed)
PROGRESS NOTE    Carrie Coffey  WUJ:[id number] DOB: 1986-04-08 DOA: 12/11/2022  PCP: Hoover Browns, MD   Brief Narrative:  This 37 years old female with PMH significant for anxiety, psoriasis,  recent right rotator cuff injury seen by orthopedics on 12/10/22 with plan for MRI. She presented in the ED with complaints of shortness of breath, RUQ abdominal pain and right shoulder pain for 1 week.  Workup in the ED, RUQ ultrasound without cholelithiasis or acute cholecystitis.  CT A&P showed patchy bilateral lower lobe infiltrates, small pleural effusion.  CTA chest demonstrated bilateral pulmonary embolism with mild right heart strain and posterior RLL infiltrate.  Patient was initiated on IV antibiotics and heparin gtt.  Patient was initially admitted under ICU, subsequently transferred to [facility].  Assessment & Plan:   Principal Problem:   Pulmonary embolism (HCC)  Bilateral pulmonary embolism: Patient presented with shortness of breath, RUQ pain, right shoulder pain for 1 week. CTA chest showed bilateral pulmonary embolism with evidence of right heart strain. Patient was initiated on therapeutic anticoagulation with heparin gtt. Troponin 38 slightly elevated likely secondary to PE, BNP 60.4,  procalcitonin 0.10, Lactic acid 1.0 Echo showed LVEF 60 to 65%, right ventricle systolic function is normal. Obtain lower extremity Doppler. Pulmonology recommended to continue anticoagulation with subsequent transition to oral anticoagulation. She may need hematology referral as an outpatient for thromboembolism.  Acute hypoxic respiratory failure: Right lower lobe pneumonia: Continue supplemental oxygen, wean as tolerated Continue Vanco/Zosyn for broad-spectrum antibiotic coverage. Follow-up blood cultures and sputum culture.  Small right pleural effusion. Continue antibiotics, effusion small for drainage.  Right breast retroareolar lesion: Obtain outpatient MRI breast and eval.  Anxiety  disorder: Resume Xanax as needed  DVT prophylaxis: Heparin IV Code Status: Full code Family Communication: No family at bed side. Disposition Plan:    Status is: Inpatient Remains inpatient appropriate because: Admitted for bilateral pulmonary embolism requiring IV heparin with subsequent plan to change to oral anticoagulants before discharge.    Consultants:  Pulmonology  Procedures: CTA  chest, CTA A/P  Antimicrobials: Anti-infectives (From admission, onward)    Start     Dose/Rate Route Frequency Ordered Stop   12/13/22 0000  vancomycin (VANCOCIN) 500 mg in sodium chloride 0.9 % 100 mL IVPB  Status:  Discontinued       See Hyperspace for full Linked Orders Report.   500 mg 100 mL/hr over 60 Minutes Intravenous Every 24 hours 12/11/22 2241 12/12/22 0738   12/12/22 2300  vancomycin (VANCOCIN) IVPB 1000 mg/200 mL premix  Status:  Discontinued       See Hyperspace for full Linked Orders Report.   1,000 mg 200 mL/hr over 60 Minutes Intravenous Every 24 hours 12/11/22 2241 12/12/22 0738   12/12/22 2300  vancomycin (VANCOREADY) IVPB 1500 mg/300 mL        1,500 mg 150 mL/hr over 120 Minutes Intravenous Every 24 hours 12/12/22 0738     12/12/22 0400  piperacillin-tazobactam (ZOSYN) IVPB 3.375 g        3.375 g 12.5 mL/hr over 240 Minutes Intravenous Every 8 hours 12/11/22 2102     12/11/22 2330  vancomycin (VANCOREADY) IVPB 750 mg/150 mL  Status:  Discontinued       See Hyperspace for full Linked Orders Report.   750 mg 150 mL/hr over 60 Minutes Intravenous  Once 12/11/22 2206 12/11/22 2208   12/11/22 2330  vancomycin (VANCOCIN) IVPB 1000 mg/200 mL premix       See Hyperspace for full  Linked Orders Report.   1,000 mg 200 mL/hr over 60 Minutes Intravenous  Once 12/11/22 2216 12/12/22 0133   12/11/22 2215  vancomycin (VANCOCIN) IVPB 1000 mg/200 mL premix  Status:  Discontinued       See Hyperspace for full Linked Orders Report.   1,000 mg 200 mL/hr over 60 Minutes Intravenous   Once 12/11/22 2206 12/11/22 2208   12/11/22 2215  vancomycin (VANCOCIN) IVPB 1000 mg/200 mL premix       See Hyperspace for full Linked Orders Report.   1,000 mg 200 mL/hr over 60 Minutes Intravenous  Once 12/11/22 2216 12/12/22 0030   12/11/22 2200  ceFEPIme (MAXIPIME) 1 g in sodium chloride 0.9 % 100 mL IVPB  Status:  Discontinued        1 g 200 mL/hr over 30 Minutes Intravenous  Once 12/11/22 2149 12/11/22 2325   12/11/22 2000  piperacillin-tazobactam (ZOSYN) IVPB 3.375 g        3.375 g 100 mL/hr over 30 Minutes Intravenous  Once 12/11/22 1959 12/11/22 2037      Subjective: Patient was seen and examined at bedside.  Overnight events noted.  Patient reports doing better he remains on 2 L of supplemental oxygen.  He continued on IV heparin.  Objective: Vitals:   12/12/22 0730 12/12/22 0753 12/12/22 0800 12/12/22 0830  BP:   120/70   Pulse: 87  98 97  Resp: 17  (!) 28 (!) 27  Temp:  98.3 F (36.8 C)    TempSrc:  Oral    SpO2: 100%  100% 93%  Weight:      Height:        Intake/Output Summary (Last 24 hours) at 12/12/2022 1051 Last data filed at 12/12/2022 0800 Gross per 24 hour  Intake 2180.36 ml  Output 1500 ml  Net 680.36 ml   Filed Weights   12/11/22 1906 12/12/22 0443  Weight: 70.3 kg 75.4 kg    Examination:  General exam: Appears calm and comfortable , not in any acute distress. Respiratory system: Clear to auscultation Bilaterally . Respiratory effort normal.  RR 14 Cardiovascular system: S1 & S2 heard, regular rate and rhythm, no murmur. Gastrointestinal system: Abdomen is soft, nontender, nondistended, BS+ Central nervous system: Alert and oriented x 3. No focal neurological deficits. Extremities: No edema, no cyanosis, no clubbing. Skin: No rashes, lesions or ulcers Psychiatry: Judgement and insight appear normal. Mood & affect appropriate.     Data Reviewed: I have personally reviewed following labs and imaging studies  CBC: Recent Labs  Lab  12/11/22 1938 12/12/22 0226  WBC 9.6 9.8  HGB 11.3* 10.2*  HCT 34.9* 31.2*  MCV 96.7 96.3  PLT 258 234   Basic Metabolic Panel: Recent Labs  Lab 12/11/22 1938 12/12/22 0226  NA 134* 134*  K 3.6 3.4*  CL 104 103  CO2 22 20*  GLUCOSE 106* 158*  BUN 9 <5*  CREATININE 0.72 0.66  CALCIUM 7.9* 7.5*  MG  --  2.0  PHOS  --  2.7   GFR: Estimated Creatinine Clearance: 89.4 mL/min (by C-G formula based on SCr of 0.66 mg/dL). Liver Function Tests: Recent Labs  Lab 12/11/22 1938  AST 20  ALT 18  ALKPHOS 46  BILITOT 0.7  PROT 8.3*  ALBUMIN 3.1*   Recent Labs  Lab 12/11/22 1938  LIPASE 22   No results for input(s): "AMMONIA" in the last 168 hours. Coagulation Profile: No results for input(s): "INR", "PROTIME" in the last 168 hours. Cardiac Enzymes:  No results for input(s): "CKTOTAL", "CKMB", "CKMBINDEX", "TROPONINI" in the last 168 hours. BNP (last 3 results) No results for input(s): "PROBNP" in the last 8760 hours. HbA1C: No results for input(s): "HGBA1C" in the last 72 hours. CBG: Recent Labs  Lab 12/12/22 0121  GLUCAP 149*   Lipid Profile: No results for input(s): "CHOL", "HDL", "LDLCALC", "TRIG", "CHOLHDL", "LDLDIRECT" in the last 72 hours. Thyroid Function Tests: No results for input(s): "TSH", "T4TOTAL", "FREET4", "T3FREE", "THYROIDAB" in the last 72 hours. Anemia Panel: No results for input(s): "VITAMINB12", "FOLATE", "FERRITIN", "TIBC", "IRON", "RETICCTPCT" in the last 72 hours. Sepsis Labs: Recent Labs  Lab 12/11/22 2013 12/12/22 0225  PROCALCITON  --  <0.10  LATICACIDVEN 0.7 1.0    Recent Results (from the past 240 hour(s))  Respiratory (~20 pathogens) panel by PCR     Status: None   Collection Time: 12/12/22  1:19 AM   Specimen: Nasopharyngeal Swab; Respiratory  Result Value Ref Range Status   Adenovirus NOT DETECTED NOT DETECTED Final   Coronavirus 229E NOT DETECTED NOT DETECTED Final    Comment: (NOTE) The Coronavirus on the Respiratory  Panel, DOES NOT test for the novel  Coronavirus (2019 nCoV)    Coronavirus HKU1 NOT DETECTED NOT DETECTED Final   Coronavirus NL63 NOT DETECTED NOT DETECTED Final   Coronavirus OC43 NOT DETECTED NOT DETECTED Final   Metapneumovirus NOT DETECTED NOT DETECTED Final   Rhinovirus / Enterovirus NOT DETECTED NOT DETECTED Final   Influenza A NOT DETECTED NOT DETECTED Final   Influenza B NOT DETECTED NOT DETECTED Final   Parainfluenza Virus 1 NOT DETECTED NOT DETECTED Final   Parainfluenza Virus 2 NOT DETECTED NOT DETECTED Final   Parainfluenza Virus 3 NOT DETECTED NOT DETECTED Final   Parainfluenza Virus 4 NOT DETECTED NOT DETECTED Final   Respiratory Syncytial Virus NOT DETECTED NOT DETECTED Final   Bordetella pertussis NOT DETECTED NOT DETECTED Final   Bordetella Parapertussis NOT DETECTED NOT DETECTED Final   Chlamydophila pneumoniae NOT DETECTED NOT DETECTED Final   Mycoplasma pneumoniae NOT DETECTED NOT DETECTED Final    Comment: Performed at [facility], [address] [place], [place] [address]  MRSA Next Gen by PCR, Nasal     Status: None   Collection Time: 12/12/22  1:25 AM  Result Value Ref Range Status   MRSA by PCR Next Gen NOT DETECTED NOT DETECTED Final    Comment: (NOTE) The GeneXpert MRSA Assay (FDA approved for NASAL specimens only), is one component of a comprehensive MRSA colonization surveillance program. It is not intended to diagnose MRSA infection nor to guide or monitor treatment for MRSA infections. Test performance is not FDA approved in patients less than 46 years old. Performed at [facility], [address], [place], [place] [address]     Radiology Studies: ECHOCARDIOGRAM COMPLETE  Result Date: 12/12/2022    ECHOCARDIOGRAM REPORT   Patient Name:   CURTISHA WRICE Date of Exam: 12/12/2022 Medical Rec #:  [id number]         Height:       61.0 in Accession #:    [id number]        Weight:       166.2 lb Date of Birth:  05/26/1986          BSA:           1.746 m Patient Age:    37 years          BP:  114/63 mmHg Patient Gender: F                 HR:           92 bpm. Exam Location:  Inpatient Procedure: 2D Echo, Color Doppler and Cardiac Doppler Indications:    Pulmonary Embolus  History:        Patient has no prior history of Echocardiogram examinations. No                 significant history noted.  Sonographer:    Milbert Coulter Referring Phys: [id number] STEPHANIE M REESE IMPRESSIONS  1. Left ventricular ejection fraction, by estimation, is 60 to 65%. The left ventricle has normal function. The left ventricle has no regional wall motion abnormalities. Left ventricular diastolic parameters were normal.  2. Right ventricular systolic function is normal. The right ventricular size is normal. There is mildly elevated pulmonary artery systolic pressure. The estimated right ventricular systolic pressure is 41.2 mmHg.  3. The mitral valve is normal in structure. No evidence of mitral valve regurgitation. No evidence of mitral stenosis.  4. Tricuspid valve regurgitation is mild to moderate.  5. The aortic valve is normal in structure. Aortic valve regurgitation is not visualized. No aortic stenosis is present.  6. The inferior vena cava is normal in size with <50% respiratory variability, suggesting right atrial pressure of 8 mmHg. FINDINGS  Left Ventricle: Left ventricular ejection fraction, by estimation, is 60 to 65%. The left ventricle has normal function. The left ventricle has no regional wall motion abnormalities. The left ventricular internal cavity size was normal in size. There is  no left ventricular hypertrophy. Left ventricular diastolic parameters were normal. Normal left ventricular filling pressure. Right Ventricle: The right ventricular size is normal. No increase in right ventricular wall thickness. Right ventricular systolic function is normal. There is mildly elevated pulmonary artery systolic pressure. The tricuspid regurgitant velocity is 2.88   m/s, and with an assumed right atrial pressure of 8 mmHg, the estimated right ventricular systolic pressure is 41.2 mmHg. Left Atrium: Left atrial size was normal in size. Right Atrium: Right atrial size was normal in size. Pericardium: There is no evidence of pericardial effusion. Mitral Valve: The mitral valve is normal in structure. No evidence of mitral valve regurgitation. No evidence of mitral valve stenosis. Tricuspid Valve: The tricuspid valve is normal in structure. Tricuspid valve regurgitation is mild to moderate. No evidence of tricuspid stenosis. Aortic Valve: The aortic valve is normal in structure. Aortic valve regurgitation is not visualized. No aortic stenosis is present. Aortic valve mean gradient measures 4.0 mmHg. Aortic valve peak gradient measures 7.5 mmHg. Aortic valve area, by VTI measures 2.42 cm. Pulmonic Valve: The pulmonic valve was normal in structure. Pulmonic valve regurgitation is mild. No evidence of pulmonic stenosis. Aorta: The aortic root is normal in size and structure. Venous: The inferior vena cava is normal in size with less than 50% respiratory variability, suggesting right atrial pressure of 8 mmHg. IAS/Shunts: No atrial level shunt detected by color flow Doppler.  LEFT VENTRICLE PLAX 2D LVIDd:         4.10 cm     Diastology LVIDs:         2.70 cm     LV e' medial:    12.90 cm/s LV PW:         0.90 cm     LV E/e' medial:  5.7 LV IVS:        1.00 cm  LV e' lateral:   15.30 cm/s LVOT diam:     1.90 cm     LV E/e' lateral: 4.8 LV SV:         56 LV SV Index:   32 LVOT Area:     2.84 cm  LV Volumes (MOD) LV vol d, MOD A2C: 54.0 ml LV vol d, MOD A4C: 43.7 ml LV vol s, MOD A2C: 23.3 ml LV vol s, MOD A4C: 16.0 ml LV SV MOD A2C:     30.7 ml LV SV MOD A4C:     43.7 ml LV SV MOD BP:      31.1 ml RIGHT VENTRICLE             IVC RV Basal diam:  3.00 cm     IVC diam: 1.90 cm RV Mid diam:    2.40 cm RV S prime:     12.40 cm/s TAPSE (M-mode): 2.0 cm LEFT ATRIUM             Index         RIGHT ATRIUM           Index LA diam:        3.40 cm 1.95 cm/m   RA Area:     12.50 cm LA Vol (A2C):   45.3 ml 25.95 ml/m  RA Volume:   27.80 ml  15.92 ml/m LA Vol (A4C):   28.7 ml 16.44 ml/m LA Biplane Vol: 37.3 ml 21.36 ml/m  AORTIC VALVE AV Area (Vmax):    2.11 cm AV Area (Vmean):   2.27 cm AV Area (VTI):     2.42 cm AV Vmax:           137.00 cm/s AV Vmean:          89.800 cm/s AV VTI:            0.230 m AV Peak Grad:      7.5 mmHg AV Mean Grad:      4.0 mmHg LVOT Vmax:         102.00 cm/s LVOT Vmean:        71.800 cm/s LVOT VTI:          0.196 m LVOT/AV VTI ratio: 0.85  AORTA Ao Root diam: 2.60 cm Ao Asc diam:  2.30 cm MITRAL VALVE               TRICUSPID VALVE MV Area (PHT): 4.21 cm    TR Peak grad:   33.2 mmHg MV Decel Time: 180 msec    TR Vmax:        288.00 cm/s MV E velocity: 73.40 cm/s MV A velocity: 56.60 cm/s  SHUNTS MV E/A ratio:  1.30        Systemic VTI:  0.20 m                            Systemic Diam: 1.90 cm Armanda Magic MD Electronically signed by Armanda Magic MD Signature Date/Time: 12/12/2022/10:04:11 AM    Final    CT Angio Chest PE W and/or Wo Contrast  Result Date: 12/11/2022 CLINICAL DATA:  Suspected pulmonary embolism. EXAM: CT ANGIOGRAPHY CHEST WITH CONTRAST TECHNIQUE: Multidetector CT imaging of the chest was performed using the standard protocol during bolus administration of intravenous contrast. Multiplanar CT image reconstructions and MIPs were obtained to evaluate the vascular anatomy. RADIATION DOSE REDUCTION: This exam was performed according to the departmental dose-optimization program which includes  automated exposure control, adjustment of the mA and/or kV according to patient size and/or use of iterative reconstruction technique. CONTRAST:  60mL OMNIPAQUE IOHEXOL 350 MG/ML SOLN COMPARISON:  None Available. FINDINGS: Cardiovascular: The thoracic aorta is normal in appearance. Satisfactory opacification of the pulmonary arteries to the segmental level. Marked  severity areas of intraluminal low attenuation are seen throughout numerous bilateral upper lobe, right middle lobe and bilateral lower lobe branches of the bilateral pulmonary arteries. No saddle embolus is identified. Normal heart size with mild right heart strain (RV/LV ratio of 1.14). No pericardial effusion. Mediastinum/Nodes: No enlarged mediastinal, hilar, or axillary lymph nodes. Thyroid gland, trachea, and esophagus demonstrate no significant findings. Lungs/Pleura: Marked severity posterior right lower lobe infiltrate is seen. Mild posterior left basilar atelectasis is also noted. A small right pleural effusion is seen. No pneumothorax is identified. Upper Abdomen: No acute abnormality. Musculoskeletal: No chest wall abnormality. No acute or significant osseous findings. Review of the MIP images confirms the above findings. IMPRESSION: 1. Marked severity bilateral pulmonary embolism with mild right heart strain (RV/LV ratio of 1.14). 2. Marked severity posterior right lower lobe infiltrate. 3. Small right pleural effusion. Electronically Signed   By: Aram Candela M.D.   On: 12/11/2022 22:44   CT ABDOMEN PELVIS W CONTRAST  Result Date: 12/11/2022 CLINICAL DATA:  Abdominal pain.  Biliary obstruction suspected. EXAM: CT ABDOMEN AND PELVIS WITH CONTRAST TECHNIQUE: Multidetector CT imaging of the abdomen and pelvis was performed using the standard protocol following bolus administration of intravenous contrast. RADIATION DOSE REDUCTION: This exam was performed according to the departmental dose-optimization program which includes automated exposure control, adjustment of the mA and/or kV according to patient size and/or use of iterative reconstruction technique. CONTRAST:  OMNIPAQUE IOHEXOL 300 MG/ML  SOLN COMPARISON:  12/11/2022. FINDINGS: Lower chest: There is a small right pleural effusion with patchy infiltrates in the lower lobes bilaterally. Hepatobiliary: No focal liver abnormality is seen.  No gallstones, gallbladder wall thickening, or biliary dilatation. Pancreas: Unremarkable. No pancreatic ductal dilatation or surrounding inflammatory changes. Spleen: Normal in size without focal abnormality. Adrenals/Urinary Tract: The adrenal glands are within normal limits. The kidneys enhance symmetrically. No renal calculus or hydronephrosis. The bladder is unremarkable. Stomach/Bowel: There is a small hiatal hernia. Stomach is within normal limits. Appendix appears normal. No evidence of bowel wall thickening, distention, or inflammatory changes. No free air or pneumatosis. Vascular/Lymphatic: No significant vascular findings are present. No enlarged abdominal or pelvic lymph nodes. Reproductive: Uterus and bilateral adnexa are unremarkable. Other: A trace amount of free fluid is noted in the cul-de-sac which may be physiologic. There is a fat containing periumbilical hernia. Musculoskeletal: There sclerosis at the sacroiliac joints bilaterally, greater on the left than on the right, compatible with sacroiliitis. No acute osseous abnormality. A 2.7 x 1.6 cm lesion is noted in the retroareolar space in the right breast. IMPRESSION: 1. Patchy infiltrates in the lower lobes bilaterally, concerning for pneumonia. 2. Small right pleural effusion. 3. Small hiatal hernia. 4. 2.7 x 1.6 cm lesion in the retroareolar space in the right breast. Diagnostic ultrasound is recommended for further evaluation on non emergent follow-up. Electronically Signed   By: Thornell Sartorius M.D.   On: 12/11/2022 21:50   US ABDOMEN LIMITED RUQ (LIVER/GB)  Result Date: 12/11/2022 CLINICAL DATA:  Right upper quadrant pain and nausea x6 days. EXAM: ULTRASOUND ABDOMEN LIMITED RIGHT UPPER QUADRANT COMPARISON:  None Available. FINDINGS: Gallbladder: No gallstones or wall thickening visualized (2.3 mm). No sonographic Murphy sign  noted by sonographer. Common bile duct: Diameter: 3.8 mm Liver: No focal lesion identified. Within normal limits in  parenchymal echogenicity. Portal vein is patent on color Doppler imaging with normal direction of blood flow towards the liver. Other: The study is markedly limited secondary to severe patient pain with probe pressure at every location. IMPRESSION: Markedly limited study, as described above, without evidence of cholelithiasis or acute cholecystitis. Electronically Signed   By: Aram Candela M.D.   On: 12/11/2022 20:58    Scheduled Meds:  Chlorhexidine Gluconate Cloth  6 each Topical Q0600   potassium chloride  40 mEq Oral Once   Continuous Infusions:  sodium chloride 10 mL/hr at 12/12/22 0800   heparin 1,250 Units/hr (12/12/22 0922)   piperacillin-tazobactam (ZOSYN)  IV Stopped (12/12/22 0731)   vancomycin       LOS: 0 days    Time spent: 50 mins    Willeen Niece, MD [facility]   If 7PM-7AM, please contact night-coverage

## 2022-12-12 NOTE — ED Notes (Signed)
Gave report to [employer]. [facility] here to take patient.

## 2022-12-12 NOTE — Progress Notes (Signed)
Pt arrives via [facility] from [facility] with the following pt belongings:  - 1 black cell phone - 1 pink colored blanket

## 2022-12-12 NOTE — Progress Notes (Signed)
ANTICOAGULATION CONSULT NOTE- follow-up  Pharmacy Consult for heparin Indication: pulmonary embolus  Allergies  Allergen Reactions   Apple Juice Hives and Swelling   Food Hives, Swelling and Other (See Comments)    Pt states that she is allergic to strawberries.     Pear Hives and Swelling   Allegra Allergy [Fexofenadine Hcl] Hives   Pseudoephedrine Hives    Patient Measurements: Height: 5\' 1"  (154.9 cm) Weight: 75.4 kg (166 lb 3.6 oz) IBW/kg (Calculated) : 47.8 Heparin Dosing Weight: 62.9 kg  Vital Signs: Temp: 98 F (36.7 C) (06/05 1619) Temp Source: Oral (06/05 1512) BP: 120/62 (06/05 1619) Pulse Rate: 95 (06/05 1619)  Labs: Recent Labs    12/11/22 1938 12/12/22 0226 12/12/22 0642 12/12/22 1537  HGB 11.3* 10.2*  --   --   HCT 34.9* 31.2*  --   --   PLT 258 234  --   --   HEPARINUNFRC  --   --  0.10* 0.23*  CREATININE 0.72 0.66  --   --   TROPONINIHS  --  38*  --   --      Estimated Creatinine Clearance: 89.4 mL/min (by C-G formula based on SCr of 0.66 mg/dL).   Assessment: 68 yoF presented today with abdominal pain. CTA showed bilateral PE with mild right heart strain. Pharmacy has been consulted to dose heparin for PE.   Heparin level sub-therapeutic at 0.1 units/mL.  CBC stable; no bleeding nor issue with heparin infusion per discussion with RN.  6/5 PM update: HL: 0.23 No signs of bleeding No issues with heparin infusion   Goal of Therapy:  Heparin level 0.3-0.7 units/ml Monitor platelets by anticoagulation protocol: Yes   Plan:  Heparin 900 units IV bolus, then  Increase heparin infusion to 1400 units/hr Check 8 hr heparin level Daily heparin level and CBC  Yolonda Purtle BS, PharmD, BCPS Clinical Pharmacist 12/12/2022 4:25 PM  Contact: [phone] after 3 PM  "Be curious, not judgmental..." -Debbora Dus

## 2022-12-12 NOTE — Progress Notes (Signed)
ANTICOAGULATION CONSULT NOTE  Pharmacy Consult for heparin Indication: pulmonary embolus  Allergies  Allergen Reactions   Apple Juice Hives and Swelling   Food Hives, Swelling and Other (See Comments)    Pt states that she is allergic to strawberries.     Pear Hives and Swelling   Allegra Allergy [Fexofenadine Hcl] Hives   Pseudoephedrine Hives    Patient Measurements: Height: 5\' 1"  (154.9 cm) Weight: 75.4 kg (166 lb 3.6 oz) IBW/kg (Calculated) : 47.8 Heparin Dosing Weight: 62.9 kg  Vital Signs: Temp: 98.3 F (36.8 C) (06/05 0753) Temp Source: Oral (06/05 0753) BP: 120/70 (06/05 0800) Pulse Rate: 97 (06/05 0830)  Labs: Recent Labs    12/11/22 1938 12/12/22 0226 12/12/22 0642  HGB 11.3* 10.2*  --   HCT 34.9* 31.2*  --   PLT 258 234  --   HEPARINUNFRC  --   --  0.10*  CREATININE 0.72 0.66  --   TROPONINIHS  --  38*  --      Estimated Creatinine Clearance: 89.4 mL/min (by C-G formula based on SCr of 0.66 mg/dL).   Assessment: 68 yoF presented today with abdominal pain. CTA showed bilateral PE with mild right heart strain. Pharmacy has been consulted to dose heparin for PE.   Heparin level sub-therapeutic at 0.1 units/mL.  CBC stable; no bleeding nor issue with heparin infusion per discussion with RN.  Goal of Therapy:  Heparin level 0.3-0.7 units/ml Monitor platelets by anticoagulation protocol: Yes   Plan:  Heparin 2000 units IV bolus, then  Increase heparin infusion to 1250 units/hr Check 6 hr heparin level Daily heparin level and CBC  Andra Matsuo D. Laney Potash, PharmD, BCPS, BCCCP 12/12/2022, 9:18 AM

## 2022-12-12 NOTE — Progress Notes (Signed)
eLink Physician-Brief Progress Note Patient Name: Carrie Coffey DOB: 16-Jan-1986 MRN: [id number]   Date of Service  12/12/2022  HPI/Events of Note  Patient complaining of anxiety.  eICU Interventions  Xanax 0.25 mg po x 1 ordered.        Thomasene Lot Kris No 12/12/2022, 2:15 AM

## 2022-12-12 NOTE — Progress Notes (Signed)
eLink Physician-Brief Progress Note Patient Name: Carrie Coffey DOB: February 06, 1986 MRN: [id number]   Date of Service  12/12/2022  HPI/Events of Note  Patient admitted with bilateral pulmonary embolism with mild right heart strain, she is on a Heparin gtt, she is having some right upper quadrant discomfort and a low grade fever, abdominal imaging is benign,  eICU Interventions  New Patient Evaluation. PRN Oxycodone and Tylenol ordered.        Thomasene Lot Jauan Wohl 12/12/2022, 1:37 AM

## 2022-12-13 ENCOUNTER — Inpatient Hospital Stay (HOSPITAL_COMMUNITY): Payer: BC Managed Care – PPO

## 2022-12-13 DIAGNOSIS — I2609 Other pulmonary embolism with acute cor pulmonale: Secondary | ICD-10-CM | POA: Diagnosis not present

## 2022-12-13 LAB — CBC
HCT: 31.9 % — ABNORMAL LOW (ref 36.0–46.0)
Hemoglobin: 10.5 g/dL — ABNORMAL LOW (ref 12.0–15.0)
MCH: 32.2 pg (ref 26.0–34.0)
MCHC: 32.9 g/dL (ref 30.0–36.0)
MCV: 97.9 fL (ref 80.0–100.0)
Platelets: 251 10*3/uL (ref 150–400)
RBC: 3.26 MIL/uL — ABNORMAL LOW (ref 3.87–5.11)
RDW: 13 % (ref 11.5–15.5)
WBC: 8.6 10*3/uL (ref 4.0–10.5)
nRBC: 0 % (ref 0.0–0.2)

## 2022-12-13 LAB — MISC LABCORP TEST (SEND OUT): Labcorp test code: 83935

## 2022-12-13 LAB — HEPARIN LEVEL (UNFRACTIONATED)
Heparin Unfractionated: 0.16 IU/mL — ABNORMAL LOW (ref 0.30–0.70)
Heparin Unfractionated: 0.3 IU/mL (ref 0.30–0.70)
Heparin Unfractionated: 0.67 IU/mL (ref 0.30–0.70)

## 2022-12-13 LAB — CULTURE, BLOOD (ROUTINE X 2)

## 2022-12-13 MED ORDER — HEPARIN BOLUS VIA INFUSION
2000.0000 [IU] | Freq: Once | INTRAVENOUS | Status: AC
  Administered 2022-12-13: 2000 [IU] via INTRAVENOUS
  Filled 2022-12-13 (×2): qty 2000

## 2022-12-13 NOTE — Progress Notes (Signed)
ANTICOAGULATION CONSULT NOTE - Follow Up Consult  Pharmacy Consult for heparin Indication: pulmonary embolus  Labs: Recent Labs    12/11/22 1938 12/12/22 0226 12/12/22 0642 12/12/22 1537 12/13/22 0442  HGB 11.3* 10.2*  --   --  10.5*  HCT 34.9* 31.2*  --   --  31.9*  PLT 258 234  --   --  251  HEPARINUNFRC  --   --  0.10* 0.23* 0.16*  CREATININE 0.72 0.66  --   --   --   TROPONINIHS  --  38*  --   --   --     Assessment: 37yo female subtherapeutic on heparin with lower heparin level despite increased rate; no infusion issues or signs of bleeding per RN.  Goal of Therapy:  Heparin level 0.3-0.7 units/ml   Plan:  2000 units heparin bolus. Increase heparin infusion by 4 units/kg/hr to 1700 units/hr. Check level in 6 hours.   Vernard Gambles, PharmD, BCPS 12/13/2022 5:28 AM

## 2022-12-13 NOTE — Progress Notes (Signed)
PROGRESS NOTE    Carrie Coffey  ZOX:[id number] DOB: 25-Dec-1985 DOA: 12/11/2022  PCP: Hoover Browns, MD   Brief Narrative:  This 36 years old female with PMH significant for anxiety, psoriasis,  recent right rotator cuff injury seen by orthopedics on 12/10/22 with plan for MRI. She presented in the ED with complaints of shortness of breath, RUQ abdominal pain and right shoulder pain for 1 week.  Workup in the ED, RUQ ultrasound without cholelithiasis or acute cholecystitis.  CT A&P showed patchy bilateral lower lobe infiltrates, small pleural effusion.  CTA chest demonstrated bilateral pulmonary embolism with mild right heart strain and posterior RLL infiltrate.  Patient was initiated on IV antibiotics and heparin gtt.  Patient was initially admitted under ICU, subsequently transferred to [facility].  Assessment & Plan:   Principal Problem:   Pulmonary embolism (HCC)  Bilateral pulmonary embolism: Patient presented with shortness of breath, RUQ pain, right shoulder pain for 1 week. CTA chest showed bilateral pulmonary embolism with evidence of right heart strain. Patient was initiated on therapeutic anticoagulation with heparin gtt. Troponin 38 slightly elevated likely secondary to PE, BNP 60.4,  procalcitonin 0.10, Lactic acid 1.0 Echo showed LVEF 60 to 65%, right ventricle systolic function is normal. Bilateral lower extremity venous duplex negative for DVT. Pulmonology recommended to continue anticoagulation with subsequent transition to oral anticoagulation. She may need hematology referral as an outpatient for thromboembolism.  Acute hypoxic respiratory failure: Right lower lobe pneumonia: Continued supplemental oxygen, She is successfully weaned down to room air. Continue Vanco/Zosyn for broad-spectrum antibiotic coverage. Follow-up blood cultures and sputum culture.  Small right pleural effusion. Continue antibiotics, effusion small for drainage.  Right breast retroareolar  lesion: Obtain outpatient MRI breast and eval.  Anxiety disorder: Resume Xanax as needed  DVT prophylaxis: Heparin IV Code Status: Full code Family Communication: No family at bed side. Disposition Plan:    Status is: Inpatient Remains inpatient appropriate because: Admitted for bilateral pulmonary embolism requiring IV heparin with subsequent plan to change to oral anticoagulants before discharge.    Consultants:  Pulmonology  Procedures: CTA  chest, CTA A/P  Antimicrobials: Anti-infectives (From admission, onward)    Start     Dose/Rate Route Frequency Ordered Stop   12/13/22 0000  vancomycin (VANCOCIN) 500 mg in sodium chloride 0.9 % 100 mL IVPB  Status:  Discontinued       See Hyperspace for full Linked Orders Report.   500 mg 100 mL/hr over 60 Minutes Intravenous Every 24 hours 12/11/22 2241 12/12/22 0738   12/12/22 2300  vancomycin (VANCOCIN) IVPB 1000 mg/200 mL premix  Status:  Discontinued       See Hyperspace for full Linked Orders Report.   1,000 mg 200 mL/hr over 60 Minutes Intravenous Every 24 hours 12/11/22 2241 12/12/22 0738   12/12/22 2300  vancomycin (VANCOREADY) IVPB 1500 mg/300 mL  Status:  Discontinued        1,500 mg 150 mL/hr over 120 Minutes Intravenous Every 24 hours 12/12/22 0738 12/12/22 1100   12/12/22 0400  piperacillin-tazobactam (ZOSYN) IVPB 3.375 g        3.375 g 12.5 mL/hr over 240 Minutes Intravenous Every 8 hours 12/11/22 2102     12/11/22 2330  vancomycin (VANCOREADY) IVPB 750 mg/150 mL  Status:  Discontinued       See Hyperspace for full Linked Orders Report.   750 mg 150 mL/hr over 60 Minutes Intravenous  Once 12/11/22 2206 12/11/22 2208   12/11/22 2330  vancomycin (VANCOCIN) IVPB 1000  mg/200 mL premix       See Hyperspace for full Linked Orders Report.   1,000 mg 200 mL/hr over 60 Minutes Intravenous  Once 12/11/22 2216 12/12/22 0133   12/11/22 2215  vancomycin (VANCOCIN) IVPB 1000 mg/200 mL premix  Status:  Discontinued       See  Hyperspace for full Linked Orders Report.   1,000 mg 200 mL/hr over 60 Minutes Intravenous  Once 12/11/22 2206 12/11/22 2208   12/11/22 2215  vancomycin (VANCOCIN) IVPB 1000 mg/200 mL premix       See Hyperspace for full Linked Orders Report.   1,000 mg 200 mL/hr over 60 Minutes Intravenous  Once 12/11/22 2216 12/12/22 0030   12/11/22 2200  ceFEPIme (MAXIPIME) 1 g in sodium chloride 0.9 % 100 mL IVPB  Status:  Discontinued        1 g 200 mL/hr over 30 Minutes Intravenous  Once 12/11/22 2149 12/11/22 2325   12/11/22 2000  piperacillin-tazobactam (ZOSYN) IVPB 3.375 g        3.375 g 100 mL/hr over 30 Minutes Intravenous  Once 12/11/22 1959 12/11/22 2037      Subjective: Patient was seen and examined at bedside.  Overnight events noted.   Patient report doing much better.  She is successfully weaned down to room air.  She is continued on IV heparin.  Objective: Vitals:   12/12/22 1943 12/12/22 2042 12/13/22 0400 12/13/22 0759  BP:  136/82 118/71 115/71  Pulse:  97 90 85  Resp:  18 18 18   Temp: 99.4 F (37.4 C) 98.4 F (36.9 C) 98.5 F (36.9 C) 97.9 F (36.6 C)  TempSrc: Oral Oral Oral   SpO2:  100% 97% 94%  Weight:      Height:        Intake/Output Summary (Last 24 hours) at 12/13/2022 1024 Last data filed at 12/13/2022 0800 Gross per 24 hour  Intake 630.18 ml  Output 1650 ml  Net -1019.82 ml   Filed Weights   12/11/22 1906 12/12/22 0443  Weight: 70.3 kg 75.4 kg    Examination:  General exam: Appears calm and comfortable , not in any acute distress. Respiratory system: Clear to auscultation Bilaterally .  Respiratory effort normal, RR 13. Cardiovascular system: S1 & S2 heard, regular rate and rhythm, no murmur. Gastrointestinal system: Abdomen is soft, nontender, nondistended, BS+ Central nervous system: Alert and oriented x 3. No focal neurological deficits. Extremities: No edema, no cyanosis, no clubbing. Skin: No rashes, lesions or ulcers Psychiatry: Judgement and  insight appear normal. Mood & affect appropriate.     Data Reviewed: I have personally reviewed following labs and imaging studies  CBC: Recent Labs  Lab 12/11/22 1938 12/12/22 0226 12/13/22 0442  WBC 9.6 9.8 8.6  HGB 11.3* 10.2* 10.5*  HCT 34.9* 31.2* 31.9*  MCV 96.7 96.3 97.9  PLT 258 234 251   Basic Metabolic Panel: Recent Labs  Lab 12/11/22 1938 12/12/22 0226  NA 134* 134*  K 3.6 3.4*  CL 104 103  CO2 22 20*  GLUCOSE 106* 158*  BUN 9 <5*  CREATININE 0.72 0.66  CALCIUM 7.9* 7.5*  MG  --  2.0  PHOS  --  2.7   GFR: Estimated Creatinine Clearance: 89.4 mL/min (by C-G formula based on SCr of 0.66 mg/dL). Liver Function Tests: Recent Labs  Lab 12/11/22 1938  AST 20  ALT 18  ALKPHOS 46  BILITOT 0.7  PROT 8.3*  ALBUMIN 3.1*   Recent Labs  Lab 12/11/22  1938  LIPASE 22   No results for input(s): "AMMONIA" in the last 168 hours. Coagulation Profile: No results for input(s): "INR", "PROTIME" in the last 168 hours. Cardiac Enzymes: No results for input(s): "CKTOTAL", "CKMB", "CKMBINDEX", "TROPONINI" in the last 168 hours. BNP (last 3 results) No results for input(s): "PROBNP" in the last 8760 hours. HbA1C: No results for input(s): "HGBA1C" in the last 72 hours. CBG: Recent Labs  Lab 12/12/22 0121  GLUCAP 149*   Lipid Profile: No results for input(s): "CHOL", "HDL", "LDLCALC", "TRIG", "CHOLHDL", "LDLDIRECT" in the last 72 hours. Thyroid Function Tests: No results for input(s): "TSH", "T4TOTAL", "FREET4", "T3FREE", "THYROIDAB" in the last 72 hours. Anemia Panel: No results for input(s): "VITAMINB12", "FOLATE", "FERRITIN", "TIBC", "IRON", "RETICCTPCT" in the last 72 hours. Sepsis Labs: Recent Labs  Lab 12/11/22 2013 12/12/22 0225  PROCALCITON  --  <0.10  LATICACIDVEN 0.7 1.0    Recent Results (from the past 240 hour(s))  Blood culture (routine x 2)     Status: None (Preliminary result)   Collection Time: 12/11/22 10:45 PM   Specimen: BLOOD   Result Value Ref Range Status   Specimen Description   Final    BLOOD LEFT ANTECUBITAL Performed at [facility], [address] [place], [place] [address]    Special Requests   Final    BOTTLES DRAWN AEROBIC AND ANAEROBIC Blood Culture adequate volume Performed at [facility], [address] [place], [place] [address]    Culture   Final    NO GROWTH < 24 HOURS Performed at [facility], [address] [place], [place] [address]    Report Status PENDING  Incomplete  Blood culture (routine x 2)     Status: None (Preliminary result)   Collection Time: 12/11/22 10:50 PM   Specimen: BLOOD  Result Value Ref Range Status   Specimen Description   Final    BLOOD BLOOD LEFT FOREARM Performed at [facility], [address] [place], [place] [address]    Special Requests   Final    BOTTLES DRAWN AEROBIC AND ANAEROBIC Blood Culture adequate volume Performed at [facility], [address] [place], [place] [address]    Culture   Final    NO GROWTH < 24 HOURS Performed at [facility], [address] [place], [place] [address]    Report Status PENDING  Incomplete  Respiratory (~20 pathogens) panel by PCR     Status: None   Collection Time: 12/12/22  1:19 AM   Specimen: Nasopharyngeal Swab; Respiratory  Result Value Ref Range Status   Adenovirus NOT DETECTED NOT DETECTED Final   Coronavirus 229E NOT DETECTED NOT DETECTED Final    Comment: (NOTE) The Coronavirus on the Respiratory Panel, DOES NOT test for the novel  Coronavirus (2019 nCoV)    Coronavirus HKU1 NOT DETECTED NOT DETECTED Final   Coronavirus NL63 NOT DETECTED NOT DETECTED Final   Coronavirus OC43 NOT DETECTED NOT DETECTED Final   Metapneumovirus NOT DETECTED NOT DETECTED Final   Rhinovirus / Enterovirus NOT DETECTED NOT DETECTED Final   Influenza A NOT DETECTED NOT DETECTED Final   Influenza B NOT DETECTED NOT DETECTED Final   Parainfluenza Virus 1 NOT  DETECTED NOT DETECTED Final   Parainfluenza Virus 2 NOT DETECTED NOT DETECTED Final   Parainfluenza Virus 3 NOT DETECTED NOT DETECTED Final   Parainfluenza Virus 4 NOT DETECTED NOT DETECTED Final   Respiratory Syncytial  Virus NOT DETECTED NOT DETECTED Final   Bordetella pertussis NOT DETECTED NOT DETECTED Final   Bordetella Parapertussis NOT DETECTED NOT DETECTED Final   Chlamydophila pneumoniae NOT DETECTED NOT DETECTED Final   Mycoplasma pneumoniae NOT DETECTED NOT DETECTED Final    Comment: Performed at [facility], [address] [place], [place] [address]  MRSA Next Gen by PCR, Nasal     Status: None   Collection Time: 12/12/22  1:25 AM  Result Value Ref Range Status   MRSA by PCR Next Gen NOT DETECTED NOT DETECTED Final    Comment: (NOTE) The GeneXpert MRSA Assay (FDA approved for NASAL specimens only), is one component of a comprehensive MRSA colonization surveillance program. It is not intended to diagnose MRSA infection nor to guide or monitor treatment for MRSA infections. Test performance is not FDA approved in patients less than 33 years old. Performed at [facility], [address], [place], [place] [address]     Radiology Studies: VAS [place] LOWER EXTREMITY VENOUS (DVT)  Result Date: 12/12/2022  Lower Venous DVT Study Patient Name:  Carrie Coffey  Date of Exam:   12/12/2022 Medical Rec #: [id number]          Accession #:    [id number] Date of Birth: [birth year]           Patient Gender: F Patient Age:   34 years Exam Location:  [facility] Procedure:      VAS [place] LOWER EXTREMITY VENOUS (DVT) Referring Phys: STEPHANIE REESE --------------------------------------------------------------------------------  Indications: Pulmonary embolism. Other Indications: Recent rotator cuff injury- patient reports decrease in                    activity during recovery. Comparison Study: No prior studies. Performing Technologist: Jean Rosenthal RDMS, RVT  Examination Guidelines:  A complete evaluation includes B-mode imaging, spectral Doppler, color Doppler, and power Doppler as needed of all accessible portions of each vessel. Bilateral testing is considered an integral part of a complete examination. Limited examinations for reoccurring indications may be performed as noted. The reflux portion of the exam is performed with the patient in reverse Trendelenburg.  +---------+---------------+---------+-----------+----------+--------------+ RIGHT    CompressibilityPhasicitySpontaneityPropertiesThrombus Aging +---------+---------------+---------+-----------+----------+--------------+ CFV      Full           Yes      Yes                                 +---------+---------------+---------+-----------+----------+--------------+ SFJ      Full                                                        +---------+---------------+---------+-----------+----------+--------------+ FV Prox  Full                                                        +---------+---------------+---------+-----------+----------+--------------+ FV Mid   Full                                                        +---------+---------------+---------+-----------+----------+--------------+  FV DistalFull                                                        +---------+---------------+---------+-----------+----------+--------------+ PFV      Full                                                        +---------+---------------+---------+-----------+----------+--------------+ POP      Full           Yes      Yes                                 +---------+---------------+---------+-----------+----------+--------------+ PTV      Full                                                        +---------+---------------+---------+-----------+----------+--------------+ PERO     Full                                                         +---------+---------------+---------+-----------+----------+--------------+ Gastroc  Full                                                        +---------+---------------+---------+-----------+----------+--------------+   +---------+---------------+---------+-----------+----------+--------------+ LEFT     CompressibilityPhasicitySpontaneityPropertiesThrombus Aging +---------+---------------+---------+-----------+----------+--------------+ CFV      Full           Yes      Yes                                 +---------+---------------+---------+-----------+----------+--------------+ SFJ      Full                                                        +---------+---------------+---------+-----------+----------+--------------+ FV Prox  Full                                                        +---------+---------------+---------+-----------+----------+--------------+ FV Mid   Full                                                        +---------+---------------+---------+-----------+----------+--------------+  FV DistalFull                                                        +---------+---------------+---------+-----------+----------+--------------+ PFV      Full                                                        +---------+---------------+---------+-----------+----------+--------------+ POP      Full           Yes      Yes                                 +---------+---------------+---------+-----------+----------+--------------+ PTV      Full                                                        +---------+---------------+---------+-----------+----------+--------------+ PERO     Full                                                        +---------+---------------+---------+-----------+----------+--------------+ Gastroc  Full                                                         +---------+---------------+---------+-----------+----------+--------------+     Summary: RIGHT: - There is no evidence of deep vein thrombosis in the lower extremity.  - No cystic structure found in the popliteal fossa.  LEFT: - There is no evidence of deep vein thrombosis in the lower extremity.  - No cystic structure found in the popliteal fossa.  *See table(s) above for measurements and observations. Electronically signed by Heath Lark on 12/12/2022 at 6:07:38 PM.    Final    ECHOCARDIOGRAM COMPLETE  Result Date: 12/12/2022    ECHOCARDIOGRAM REPORT   Patient Name:   CORALYNN NIPPLE Date of Exam: 12/12/2022 Medical Rec #:  [id number]         Height:       61.0 in Accession #:    [id number]        Weight:       166.2 lb Date of Birth:  [birth year]          BSA:          1.746 m Patient Age:    37 years          BP:           114/63 mmHg Patient Gender: F                 HR:           92 bpm. Exam Location:  Inpatient Procedure: 2D Echo, Color Doppler and Cardiac Doppler Indications:    Pulmonary Embolus  History:        Patient has no prior history of Echocardiogram examinations. No                 significant history noted.  Sonographer:    Milbert Coulter Referring Phys: [id number] STEPHANIE M REESE IMPRESSIONS  1. Left ventricular ejection fraction, by estimation, is 60 to 65%. The left ventricle has normal function. The left ventricle has no regional wall motion abnormalities. Left ventricular diastolic parameters were normal.  2. Right ventricular systolic function is normal. The right ventricular size is normal. There is mildly elevated pulmonary artery systolic pressure. The estimated right ventricular systolic pressure is 41.2 mmHg.  3. The mitral valve is normal in structure. No evidence of mitral valve regurgitation. No evidence of mitral stenosis.  4. Tricuspid valve regurgitation is mild to moderate.  5. The aortic valve is normal in structure. Aortic valve regurgitation is not visualized. No aortic stenosis  is present.  6. The inferior vena cava is normal in size with <50% respiratory variability, suggesting right atrial pressure of 8 mmHg. FINDINGS  Left Ventricle: Left ventricular ejection fraction, by estimation, is 60 to 65%. The left ventricle has normal function. The left ventricle has no regional wall motion abnormalities. The left ventricular internal cavity size was normal in size. There is  no left ventricular hypertrophy. Left ventricular diastolic parameters were normal. Normal left ventricular filling pressure. Right Ventricle: The right ventricular size is normal. No increase in right ventricular wall thickness. Right ventricular systolic function is normal. There is mildly elevated pulmonary artery systolic pressure. The tricuspid regurgitant velocity is 2.88  m/s, and with an assumed right atrial pressure of 8 mmHg, the estimated right ventricular systolic pressure is 41.2 mmHg. Left Atrium: Left atrial size was normal in size. Right Atrium: Right atrial size was normal in size. Pericardium: There is no evidence of pericardial effusion. Mitral Valve: The mitral valve is normal in structure. No evidence of mitral valve regurgitation. No evidence of mitral valve stenosis. Tricuspid Valve: The tricuspid valve is normal in structure. Tricuspid valve regurgitation is mild to moderate. No evidence of tricuspid stenosis. Aortic Valve: The aortic valve is normal in structure. Aortic valve regurgitation is not visualized. No aortic stenosis is present. Aortic valve mean gradient measures 4.0 mmHg. Aortic valve peak gradient measures 7.5 mmHg. Aortic valve area, by VTI measures 2.42 cm. Pulmonic Valve: The pulmonic valve was normal in structure. Pulmonic valve regurgitation is mild. No evidence of pulmonic stenosis. Aorta: The aortic root is normal in size and structure. Venous: The inferior vena cava is normal in size with less than 50% respiratory variability, suggesting right atrial pressure of 8 mmHg.  IAS/Shunts: No atrial level shunt detected by color flow Doppler.  LEFT VENTRICLE PLAX 2D LVIDd:         4.10 cm     Diastology LVIDs:         2.70 cm     LV e' medial:    12.90 cm/s LV PW:         0.90 cm     LV E/e' medial:  5.7 LV IVS:        1.00 cm     LV e' lateral:   15.30 cm/s LVOT diam:     1.90 cm     LV E/e' lateral: 4.8 LV SV:         56  LV SV Index:   32 LVOT Area:     2.84 cm  LV Volumes (MOD) LV vol d, MOD A2C: 54.0 ml LV vol d, MOD A4C: 43.7 ml LV vol s, MOD A2C: 23.3 ml LV vol s, MOD A4C: 16.0 ml LV SV MOD A2C:     30.7 ml LV SV MOD A4C:     43.7 ml LV SV MOD BP:      31.1 ml RIGHT VENTRICLE             IVC RV Basal diam:  3.00 cm     IVC diam: 1.90 cm RV Mid diam:    2.40 cm RV S prime:     12.40 cm/s TAPSE (M-mode): 2.0 cm LEFT ATRIUM             Index        RIGHT ATRIUM           Index LA diam:        3.40 cm 1.95 cm/m   RA Area:     12.50 cm LA Vol (A2C):   45.3 ml 25.95 ml/m  RA Volume:   27.80 ml  15.92 ml/m LA Vol (A4C):   28.7 ml 16.44 ml/m LA Biplane Vol: 37.3 ml 21.36 ml/m  AORTIC VALVE AV Area (Vmax):    2.11 cm AV Area (Vmean):   2.27 cm AV Area (VTI):     2.42 cm AV Vmax:           137.00 cm/s AV Vmean:          89.800 cm/s AV VTI:            0.230 m AV Peak Grad:      7.5 mmHg AV Mean Grad:      4.0 mmHg LVOT Vmax:         102.00 cm/s LVOT Vmean:        71.800 cm/s LVOT VTI:          0.196 m LVOT/AV VTI ratio: 0.85  AORTA Ao Root diam: 2.60 cm Ao Asc diam:  2.30 cm MITRAL VALVE               TRICUSPID VALVE MV Area (PHT): 4.21 cm    TR Peak grad:   33.2 mmHg MV Decel Time: 180 msec    TR Vmax:        288.00 cm/s MV E velocity: 73.40 cm/s MV A velocity: 56.60 cm/s  SHUNTS MV E/A ratio:  1.30        Systemic VTI:  0.20 m                            Systemic Diam: 1.90 cm Armanda Magic MD Electronically signed by Armanda Magic MD Signature Date/Time: 12/12/2022/10:04:11 AM    Final    CT Angio Chest PE W and/or Wo Contrast  Result Date: 12/11/2022 CLINICAL DATA:  Suspected  pulmonary embolism. EXAM: CT ANGIOGRAPHY CHEST WITH CONTRAST TECHNIQUE: Multidetector CT imaging of the chest was performed using the standard protocol during bolus administration of intravenous contrast. Multiplanar CT image reconstructions and MIPs were obtained to evaluate the vascular anatomy. RADIATION DOSE REDUCTION: This exam was performed according to the departmental dose-optimization program which includes automated exposure control, adjustment of the mA and/or kV according to patient size and/or use of iterative reconstruction technique. CONTRAST:  60mL OMNIPAQUE IOHEXOL 350 MG/ML SOLN COMPARISON:  None Available. FINDINGS: Cardiovascular: The  thoracic aorta is normal in appearance. Satisfactory opacification of the pulmonary arteries to the segmental level. Marked severity areas of intraluminal low attenuation are seen throughout numerous bilateral upper lobe, right middle lobe and bilateral lower lobe branches of the bilateral pulmonary arteries. No saddle embolus is identified. Normal heart size with mild right heart strain (RV/LV ratio of 1.14). No pericardial effusion. Mediastinum/Nodes: No enlarged mediastinal, hilar, or axillary lymph nodes. Thyroid gland, trachea, and esophagus demonstrate no significant findings. Lungs/Pleura: Marked severity posterior right lower lobe infiltrate is seen. Mild posterior left basilar atelectasis is also noted. A small right pleural effusion is seen. No pneumothorax is identified. Upper Abdomen: No acute abnormality. Musculoskeletal: No chest wall abnormality. No acute or significant osseous findings. Review of the MIP images confirms the above findings. IMPRESSION: 1. Marked severity bilateral pulmonary embolism with mild right heart strain (RV/LV ratio of 1.14). 2. Marked severity posterior right lower lobe infiltrate. 3. Small right pleural effusion. Electronically Signed   By: Aram Candela M.D.   On: 12/11/2022 22:44   CT ABDOMEN PELVIS W  CONTRAST  Result Date: 12/11/2022 CLINICAL DATA:  Abdominal pain.  Biliary obstruction suspected. EXAM: CT ABDOMEN AND PELVIS WITH CONTRAST TECHNIQUE: Multidetector CT imaging of the abdomen and pelvis was performed using the standard protocol following bolus administration of intravenous contrast. RADIATION DOSE REDUCTION: This exam was performed according to the departmental dose-optimization program which includes automated exposure control, adjustment of the mA and/or kV according to patient size and/or use of iterative reconstruction technique. CONTRAST:  OMNIPAQUE IOHEXOL 300 MG/ML  SOLN COMPARISON:  12/11/2022. FINDINGS: Lower chest: There is a small right pleural effusion with patchy infiltrates in the lower lobes bilaterally. Hepatobiliary: No focal liver abnormality is seen. No gallstones, gallbladder wall thickening, or biliary dilatation. Pancreas: Unremarkable. No pancreatic ductal dilatation or surrounding inflammatory changes. Spleen: Normal in size without focal abnormality. Adrenals/Urinary Tract: The adrenal glands are within normal limits. The kidneys enhance symmetrically. No renal calculus or hydronephrosis. The bladder is unremarkable. Stomach/Bowel: There is a small hiatal hernia. Stomach is within normal limits. Appendix appears normal. No evidence of bowel wall thickening, distention, or inflammatory changes. No free air or pneumatosis. Vascular/Lymphatic: No significant vascular findings are present. No enlarged abdominal or pelvic lymph nodes. Reproductive: Uterus and bilateral adnexa are unremarkable. Other: A trace amount of free fluid is noted in the cul-de-sac which may be physiologic. There is a fat containing periumbilical hernia. Musculoskeletal: There sclerosis at the sacroiliac joints bilaterally, greater on the left than on the right, compatible with sacroiliitis. No acute osseous abnormality. A 2.7 x 1.6 cm lesion is noted in the retroareolar space in the right breast.  IMPRESSION: 1. Patchy infiltrates in the lower lobes bilaterally, concerning for pneumonia. 2. Small right pleural effusion. 3. Small hiatal hernia. 4. 2.7 x 1.6 cm lesion in the retroareolar space in the right breast. Diagnostic ultrasound is recommended for further evaluation on non emergent follow-up. Electronically Signed   By: Thornell Sartorius M.D.   On: 12/11/2022 21:50   US ABDOMEN LIMITED RUQ (LIVER/GB)  Result Date: 12/11/2022 CLINICAL DATA:  Right upper quadrant pain and nausea x6 days. EXAM: ULTRASOUND ABDOMEN LIMITED RIGHT UPPER QUADRANT COMPARISON:  None Available. FINDINGS: Gallbladder: No gallstones or wall thickening visualized (2.3 mm). No sonographic Murphy sign noted by sonographer. Common bile duct: Diameter: 3.8 mm Liver: No focal lesion identified. Within normal limits in parenchymal echogenicity. Portal vein is patent on color Doppler imaging with normal direction of blood flow  towards the liver. Other: The study is markedly limited secondary to severe patient pain with probe pressure at every location. IMPRESSION: Markedly limited study, as described above, without evidence of cholelithiasis or acute cholecystitis. Electronically Signed   By: Aram Candela M.D.   On: 12/11/2022 20:58    Scheduled Meds:  Chlorhexidine Gluconate Cloth  6 each Topical Q0600   Continuous Infusions:  sodium chloride Stopped (12/12/22 1357)   heparin 1,700 Units/hr (12/13/22 0912)   piperacillin-tazobactam (ZOSYN)  IV 3.375 g (12/13/22 0558)     LOS: 1 day    Time spent: 35 mins    Willeen Niece, MD [facility]   If 7PM-7AM, please contact night-coverage

## 2022-12-13 NOTE — Progress Notes (Signed)
ANTICOAGULATION CONSULT NOTE- follow-up  Pharmacy Consult for heparin Indication: pulmonary embolus  Allergies  Allergen Reactions   Apple Juice Hives and Swelling   Food Hives, Swelling and Other (See Comments)    Pt states that she is allergic to strawberries.     Pear Hives and Swelling   Allegra Allergy [Fexofenadine Hcl] Hives   Pseudoephedrine Hives    Patient Measurements: Height: 5\' 1"  (154.9 cm) Weight: 75.4 kg (166 lb 3.6 oz) IBW/kg (Calculated) : 47.8 Heparin Dosing Weight: 62.9 kg  Vital Signs: Temp: 98 F (36.7 C) (06/06 1630) BP: 123/77 (06/06 1630) Pulse Rate: 90 (06/06 1630)  Labs: Recent Labs    12/11/22 1938 12/12/22 0226 12/12/22 1610 12/13/22 0442 12/13/22 1117 12/13/22 2011  HGB 11.3* 10.2*  --  10.5*  --   --   HCT 34.9* 31.2*  --  31.9*  --   --   PLT 258 234  --  251  --   --   HEPARINUNFRC  --   --    < > 0.16* 0.30 0.67  CREATININE 0.72 0.66  --   --   --   --   TROPONINIHS  --  38*  --   --   --   --    < > = values in this interval not displayed.     Estimated Creatinine Clearance: 89.4 mL/min (by C-G formula based on SCr of 0.66 mg/dL).   Assessment: 78 yoF presented today with abdominal pain. CTA showed bilateral PE with mild right heart strain. Pharmacy has been consulted to dose heparin for PE.   Heparin level came back therapeutic but on the lower end. We will increase rate and check a confirm level.   6/6 PM update: HL 0.67 No signs of bleeding or issues with gtt  Goal of Therapy:  Heparin level 0.3-0.7 units/ml Monitor platelets by anticoagulation protocol: Yes   Plan:  Continue Heparin infusion at 1850 units/hr Daily heparin level and CBC  Avraham Benish BS, PharmD, BCPS Clinical Pharmacist 12/13/2022 8:49 PM  Contact: [phone] after 3 PM  "Be curious, not judgmental..." -Debbora Dus

## 2022-12-13 NOTE — Progress Notes (Signed)
ANTICOAGULATION CONSULT NOTE- follow-up  Pharmacy Consult for heparin Indication: pulmonary embolus  Allergies  Allergen Reactions   Apple Juice Hives and Swelling   Food Hives, Swelling and Other (See Comments)    Pt states that she is allergic to strawberries.     Pear Hives and Swelling   Allegra Allergy [Fexofenadine Hcl] Hives   Pseudoephedrine Hives    Patient Measurements: Height: 5\' 1"  (154.9 cm) Weight: 75.4 kg (166 lb 3.6 oz) IBW/kg (Calculated) : 47.8 Heparin Dosing Weight: 62.9 kg  Vital Signs: Temp: 97.9 F (36.6 C) (06/06 0759) Temp Source: Oral (06/06 0400) BP: 115/71 (06/06 0759) Pulse Rate: 85 (06/06 0759)  Labs: Recent Labs    12/11/22 1938 12/12/22 0226 12/12/22 0981 12/12/22 1537 12/13/22 0442 12/13/22 1117  HGB 11.3* 10.2*  --   --  10.5*  --   HCT 34.9* 31.2*  --   --  31.9*  --   PLT 258 234  --   --  251  --   HEPARINUNFRC  --   --    < > 0.23* 0.16* 0.30  CREATININE 0.72 0.66  --   --   --   --   TROPONINIHS  --  38*  --   --   --   --    < > = values in this interval not displayed.     Estimated Creatinine Clearance: 89.4 mL/min (by C-G formula based on SCr of 0.66 mg/dL).   Assessment: 40 yoF presented today with abdominal pain. CTA showed bilateral PE with mild right heart strain. Pharmacy has been consulted to dose heparin for PE.   Heparin level came back therapeutic but on the lower end. We will increase rate and check a confirm level.   Goal of Therapy:  Heparin level 0.3-0.7 units/ml Monitor platelets by anticoagulation protocol: Yes   Plan:  Heparin 1850 units IV bolus, then  Check 6 hr heparin level Daily heparin level and CBC  Ulyses Southward, PharmD, BCIDP, AAHIVP, CPP Infectious Disease Pharmacist 12/13/2022 1:15 PM

## 2022-12-14 ENCOUNTER — Other Ambulatory Visit (HOSPITAL_COMMUNITY): Payer: Self-pay

## 2022-12-14 DIAGNOSIS — I2609 Other pulmonary embolism with acute cor pulmonale: Secondary | ICD-10-CM | POA: Diagnosis not present

## 2022-12-14 LAB — COMPREHENSIVE METABOLIC PANEL
ALT: 36 U/L (ref 0–44)
AST: 36 U/L (ref 15–41)
Albumin: 2.8 g/dL — ABNORMAL LOW (ref 3.5–5.0)
Alkaline Phosphatase: 63 U/L (ref 38–126)
Anion gap: 8 (ref 5–15)
BUN: 8 mg/dL (ref 6–20)
CO2: 24 mmol/L (ref 22–32)
Calcium: 8.9 mg/dL (ref 8.9–10.3)
Chloride: 105 mmol/L (ref 98–111)
Creatinine, Ser: 0.79 mg/dL (ref 0.44–1.00)
GFR, Estimated: >60 mL/min (ref >60)
Glucose, Bld: 134 mg/dL — ABNORMAL HIGH (ref 70–99)
Potassium: 4.4 mmol/L (ref 3.5–5.1)
Sodium: 137 mmol/L (ref 135–145)
Total Bilirubin: 1 mg/dL (ref 0.3–1.2)
Total Protein: 8.3 g/dL — ABNORMAL HIGH (ref 6.5–8.1)

## 2022-12-14 LAB — PHOSPHORUS: Phosphorus: 3 mg/dL (ref 2.5–4.6)

## 2022-12-14 LAB — TROPONIN I (HIGH SENSITIVITY): Troponin I (High Sensitivity): 4 ng/L (ref <18)

## 2022-12-14 LAB — CULTURE, BLOOD (ROUTINE X 2)
Culture: NO GROWTH
Special Requests: ADEQUATE

## 2022-12-14 LAB — LEGIONELLA PNEUMOPHILA SEROGP 1 UR AG: L. pneumophila Serogp 1 Ur Ag: NEGATIVE

## 2022-12-14 LAB — MAGNESIUM: Magnesium: 2.4 mg/dL (ref 1.7–2.4)

## 2022-12-14 MED ORDER — APIXABAN 5 MG PO TABS
10.0000 mg | ORAL_TABLET | Freq: Two times a day (BID) | ORAL | Status: DC
  Administered 2022-12-14 – 2022-12-15 (×3): 10 mg via ORAL
  Filled 2022-12-14 (×3): qty 2

## 2022-12-14 MED ORDER — APIXABAN 5 MG PO TABS: 5.0000 mg | ORAL_TABLET | Freq: Two times a day (BID) | ORAL | Status: DC

## 2022-12-14 MED ORDER — ALPRAZOLAM 0.5 MG PO TABS
0.5000 mg | ORAL_TABLET | Freq: Two times a day (BID) | ORAL | Status: DC | PRN
  Administered 2022-12-14 (×2): 0.5 mg via ORAL
  Filled 2022-12-14 (×2): qty 1

## 2022-12-14 NOTE — TOC Initial Note (Signed)
Transition of Care [facility]) - Initial/Assessment Note    Patient Details  Name: Carrie Coffey MRN: [id number] Date of Birth: 1986-01-08  Transition of Care [facility]) CM/SW Contact:    Janae Bridgeman, RN Phone Number: 12/14/2022, 3:56 PM  Clinical Narrative:                 CM met with the patient at the bedside to discuss TOC needs.  The patient was admitted for bilateral PE.  The patient plans to go home today/tomorrow pending medical stability.  The patient lives at the home with her husband and 4 minor children.  The patient is a non-smoker.  Her current PCP is a listed ObGyn.  I met at the bedside with patient and she asked that I call and schedule a follow up at PCP office - [phone].  Hospital follow up with PCP was scheduled and noted in the AVS.  The patient's husband plans to provide transportation home by car.  The patient is on room air.  Expected Discharge Plan: Home/Self Care Barriers to Discharge: Continued Medical Work up   Patient Goals and CMS Choice Patient states their goals for this hospitalization and ongoing recovery are:: To return home CMS Medicare.gov Compare Post Acute Care list provided to:: Patient Choice offered to / list presented to : Patient [place] ownership interest in [facility].provided to:: Patient    Expected Discharge Plan and Services   Discharge Planning Services: CM Consult, Follow-up appt scheduled Post Acute Care Choice: Resumption of Svcs/PTA Provider Living arrangements for the past 2 months: Single Family Home                                      Prior Living Arrangements/Services Living arrangements for the past 2 months: Single Family Home Lives with:: Spouse Patient language and need for interpreter reviewed:: Yes Do you feel safe going back to the place where you live?: Yes      Need for Family Participation in Patient Care: Yes (Comment) Care giver support system in place?: Yes (comment)    Criminal Activity/Legal Involvement Pertinent to Current Situation/Hospitalization: No - Comment as needed  Activities of Daily Living      Permission Sought/Granted Permission sought to share information with : Case Manager, PCP Permission granted to share information with : Yes, Verbal Permission Granted     Permission granted to share info w AGENCY: Contacted PCP office for hospital follow up - documented in the AVS.  Permission granted to share info w Relationship: spouse - Anson Fret     Emotional Assessment Appearance:: Appears stated age Attitude/Demeanor/Rapport: Gracious Affect (typically observed): Accepting Orientation: : Oriented to Self, Oriented to Place, Oriented to  Time, Oriented to Situation Alcohol / Substance Use: Not Applicable Psych Involvement: No (comment)  Admission diagnosis:  Pulmonary embolism (HCC) [I26.99] Pneumonia of both lungs due to infectious organism, unspecified part of lung [J18.9] Multiple subsegmental pulmonary emboli without acute cor pulmonale (HCC) [I26.94] Patient Active Problem List   Diagnosis Date Noted   Pulmonary embolism (HCC) 12/11/2022   Gestational diabetes mellitus (GDM) 08/19/2021   Anxiety 08/11/2020   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, [place] - [address]  [place] [address] Phone: [phone] Fax: [phone]     Social Determinants of Health (SDOH) Social History: SDOH Screenings   Food Insecurity: No Food Insecurity (07/05/2021)  Depression (PHQ2-9): Low Risk  (07/05/2021)  Tobacco Use: Low Risk  (12/11/2022)   SDOH Interventions:     Readmission Risk Interventions    12/14/2022    3:56 PM  Readmission Risk Prevention Plan  Post Dischage Appt Complete  Medication Screening Complete  Transportation Screening  Complete

## 2022-12-14 NOTE — Discharge Instructions (Signed)
Information on my medicine - ELIQUIS (apixaban)  This medication education was reviewed with me or my healthcare representative as part of my discharge preparation.  The pharmacist that spoke with me during my hospital stay was:    Why was Eliquis prescribed for you? Eliquis was prescribed to treat blood clots that may have been found in the veins of your legs (deep vein thrombosis) or in your lungs (pulmonary embolism) and to reduce the risk of them occurring again.  What do You need to know about Eliquis ? The starting dose is 10 mg (two 5 mg tablets) taken TWICE daily for the FIRST SEVEN (7) DAYS, then on (enter date)  12/21/22  the dose is reduced to ONE 5 mg tablet taken TWICE daily.  Eliquis may be taken with or without food.   Try to take the dose about the same time in the morning and in the evening. If you have difficulty swallowing the tablet whole please discuss with your pharmacist how to take the medication safely.  Take Eliquis exactly as prescribed and DO NOT stop taking Eliquis without talking to the doctor who prescribed the medication.  Stopping may increase your risk of developing a new blood clot.  Refill your prescription before you run out.  After discharge, you should have regular check-up appointments with your healthcare provider that is prescribing your Eliquis.    What do you do if you miss a dose? If a dose of ELIQUIS is not taken at the scheduled time, take it as soon as possible on the same day and twice-daily administration should be resumed. The dose should not be doubled to make up for a missed dose.  Important Safety Information A possible side effect of Eliquis is bleeding. You should call your healthcare provider right away if you experience any of the following: Bleeding from an injury or your nose that does not stop. Unusual colored urine (red or dark brown) or unusual colored stools (red or black). Unusual bruising for unknown reasons. A  serious fall or if you hit your head (even if there is no bleeding).  Some medicines may interact with Eliquis and might increase your risk of bleeding or clotting while on Eliquis. To help avoid this, consult your healthcare provider or pharmacist prior to using any new prescription or non-prescription medications, including herbals, vitamins, non-steroidal anti-inflammatory drugs (NSAIDs) and supplements.  This website has more information on Eliquis (apixaban): http://www.eliquis.com/eliquis/home

## 2022-12-14 NOTE — Plan of Care (Signed)
Patient is resting in bed with normal vitals and respirations. Heparin drip has been d/c. Patient is now on oral eliquis. Patient was provided education on s/s of PE; if it was to occur again. Patient was educated to call for assistance for pain. No distress or SOB present. Patient is ambulating independently. Husband is at bedside. Plan of care is ongoing.  Problem: Education: Goal: Knowledge of General Education information will improve Description: Including pain rating scale, medication(s)/side effects and non-pharmacologic comfort measures Outcome: Progressing   Problem: Health Behavior/Discharge Planning: Goal: Ability to manage health-related needs will improve Outcome: Progressing   Problem: Clinical Measurements: Goal: Ability to maintain clinical measurements within normal limits will improve Outcome: Progressing Goal: Will remain free from infection Outcome: Progressing Goal: Diagnostic test results will improve Outcome: Progressing Goal: Respiratory complications will improve Outcome: Progressing Goal: Cardiovascular complication will be avoided Outcome: Progressing   Problem: Activity: Goal: Risk for activity intolerance will decrease Outcome: Progressing   Problem: Nutrition: Goal: Adequate nutrition will be maintained Outcome: Progressing   Problem: Coping: Goal: Level of anxiety will decrease Outcome: Progressing   Problem: Elimination: Goal: Will not experience complications related to bowel motility Outcome: Progressing Goal: Will not experience complications related to urinary retention Outcome: Progressing   Problem: Pain Managment: Goal: General experience of comfort will improve Outcome: Progressing   Problem: Safety: Goal: Ability to remain free from injury will improve Outcome: Progressing   Problem: Skin Integrity: Goal: Risk for impaired skin integrity will decrease Outcome: Progressing   Problem: Education: Goal: Knowledge of General  Education information will improve Description: Including pain rating scale, medication(s)/side effects and non-pharmacologic comfort measures Outcome: Progressing   Problem: Health Behavior/Discharge Planning: Goal: Ability to manage health-related needs will improve Outcome: Progressing   Problem: Clinical Measurements: Goal: Ability to maintain clinical measurements within normal limits will improve Outcome: Progressing Goal: Will remain free from infection Outcome: Progressing Goal: Diagnostic test results will improve Outcome: Progressing Goal: Respiratory complications will improve Outcome: Progressing Goal: Cardiovascular complication will be avoided Outcome: Progressing   Problem: Activity: Goal: Risk for activity intolerance will decrease Outcome: Progressing   Problem: Nutrition: Goal: Adequate nutrition will be maintained Outcome: Progressing   Problem: Coping: Goal: Level of anxiety will decrease Outcome: Progressing   Problem: Elimination: Goal: Will not experience complications related to bowel motility Outcome: Progressing Goal: Will not experience complications related to urinary retention Outcome: Progressing   Problem: Pain Managment: Goal: General experience of comfort will improve Outcome: Progressing   Problem: Safety: Goal: Ability to remain free from injury will improve Outcome: Progressing   Problem: Skin Integrity: Goal: Risk for impaired skin integrity will decrease Outcome: Progressing

## 2022-12-14 NOTE — Progress Notes (Signed)
ANTICOAGULATION CONSULT NOTE- follow-up  Pharmacy Consult for heparin>>apixaban Indication: pulmonary embolus  Allergies  Allergen Reactions   Apple Juice Hives and Swelling   Food Hives, Swelling and Other (See Comments)    Pt states that she is allergic to strawberries.     Pear Hives and Swelling   Allegra Allergy [Fexofenadine Hcl] Hives   Pseudoephedrine Hives    Patient Measurements: Height: 5\' 1"  (154.9 cm) Weight: 75.4 kg (166 lb 3.6 oz) IBW/kg (Calculated) : 47.8 Heparin Dosing Weight: 62.9 kg  Vital Signs: Temp: 97.9 F (36.6 C) (06/07 0754) Temp Source: Oral (06/07 0611) BP: 128/88 (06/07 0754) Pulse Rate: 74 (06/07 0754)  Labs: Recent Labs    12/11/22 1938 12/12/22 0226 12/12/22 1610 12/13/22 0442 12/13/22 1117 12/13/22 2011  HGB 11.3* 10.2*  --  10.5*  --   --   HCT 34.9* 31.2*  --  31.9*  --   --   PLT 258 234  --  251  --   --   HEPARINUNFRC  --   --    < > 0.16* 0.30 0.67  CREATININE 0.72 0.66  --   --   --   --   TROPONINIHS  --  38*  --   --   --   --    < > = values in this interval not displayed.     Estimated Creatinine Clearance: 89.4 mL/min (by C-G formula based on SCr of 0.66 mg/dL).   Assessment: 31 yoF presented today with abdominal pain. CTA showed bilateral PE with mild right heart strain. Pharmacy has been consulted to dose heparin for PE.   Change to apixaban today. Hgb ~10s, plt wnl, scr<1.   Goal of Therapy:  Monitor platelets by anticoagulation protocol: Yes   Plan:  Dc heparin Apixaban 10mg  PO BID x7d then 5mg  BID Rx will follow peripherally  Ulyses Southward, PharmD, BCIDP, AAHIVP, CPP Infectious Disease Pharmacist 12/14/2022 8:23 AM

## 2022-12-14 NOTE — TOC Benefit Eligibility Note (Signed)
Patient Product/process development scientist completed.    The patient is currently admitted and upon discharge could be taking Eliquis 5 mg.  The current 30 day co-pay is $30.00.   The patient is insured through [employer]   This test claim was processed through [employer]- copay amounts may vary at other pharmacies due to pharmacy/plan contracts, or as the patient moves through the different stages of their insurance plan.  Roland Earl, CPHT Pharmacy Patient Advocate Specialist [facility] Patient Advocate Team Direct Number: [phone]  Fax: [phone]

## 2022-12-14 NOTE — Progress Notes (Addendum)
PROGRESS NOTE    Carrie Coffey  ZOX:[id number] DOB: 01/15/1986 DOA: 12/11/2022  PCP: Hoover Browns, MD   Brief Narrative:  This 37 years old female with PMH significant for anxiety, psoriasis,  recent right rotator cuff injury seen by orthopedics on 12/10/22 with plan for MRI. She presented in the ED with complaints of shortness of breath, RUQ abdominal pain and right shoulder pain for 1 week.  Workup in the ED, RUQ ultrasound without cholelithiasis or acute cholecystitis.  CT A&P showed patchy bilateral lower lobe infiltrates, small pleural effusion.  CTA chest demonstrated bilateral pulmonary embolism with mild right heart strain and posterior RLL infiltrate.  Patient was initiated on IV antibiotics and heparin gtt.  Patient was initially admitted under ICU, subsequently transferred to [facility].  Assessment & Plan:   Principal Problem:   Pulmonary embolism (HCC)  Bilateral pulmonary embolism: Patient presented with shortness of breath, RUQ pain, right shoulder pain for 1 week. CTA chest showed bilateral pulmonary embolism with evidence of right heart strain. Patient was initiated on therapeutic anticoagulation with heparin gtt. Troponin 38 slightly elevated likely secondary to PE, BNP 60.4,  procalcitonin 0.10, Lactic acid 1.0 Echo showed LVEF 60 to 65%, right ventricle systolic function is normal. Bilateral lower extremity venous duplex negative for DVT. Pulmonology recommended to continue anticoagulation with subsequent transition to oral anticoagulation. She may need hematology referral as an outpatient for thromboembolism. Plan: Heparin transition to Eliquis today.  Acute hypoxic respiratory failure: Right lower lobe pneumonia: Continued supplemental oxygen, She is successfully weaned down to room air. Continue Vanco/Zosyn for broad-spectrum antibiotic coverage. Vancomycin discontinued as MRSA nares negative. Blood cultures no growth, RVP negative.  Small right pleural  effusion. Continue antibiotics, effusion small for drainage.  Right breast retroareolar lesion: Obtain outpatient MRI breast and eval.  Anxiety disorder: Resume Xanax as needed.  DVT prophylaxis: Heparin IV Code Status: Full code Family Communication: No family at bed side. Disposition Plan:    Status is: Inpatient Remains inpatient appropriate because: Admitted for bilateral pulmonary embolism requiring IV heparin with subsequent plan to change to oral anticoagulants before discharge.    Consultants:  Pulmonology  Procedures: CTA  chest, CTA A/P  Antimicrobials: Anti-infectives (From admission, onward)    Start     Dose/Rate Route Frequency Ordered Stop   12/13/22 0000  vancomycin (VANCOCIN) 500 mg in sodium chloride 0.9 % 100 mL IVPB  Status:  Discontinued       See Hyperspace for full Linked Orders Report.   500 mg 100 mL/hr over 60 Minutes Intravenous Every 24 hours 12/11/22 2241 12/12/22 0738   12/12/22 2300  vancomycin (VANCOCIN) IVPB 1000 mg/200 mL premix  Status:  Discontinued       See Hyperspace for full Linked Orders Report.   1,000 mg 200 mL/hr over 60 Minutes Intravenous Every 24 hours 12/11/22 2241 12/12/22 0738   12/12/22 2300  vancomycin (VANCOREADY) IVPB 1500 mg/300 mL  Status:  Discontinued        1,500 mg 150 mL/hr over 120 Minutes Intravenous Every 24 hours 12/12/22 0738 12/12/22 1100   12/12/22 0400  piperacillin-tazobactam (ZOSYN) IVPB 3.375 g        3.375 g 12.5 mL/hr over 240 Minutes Intravenous Every 8 hours 12/11/22 2102     12/11/22 2330  vancomycin (VANCOREADY) IVPB 750 mg/150 mL  Status:  Discontinued       See Hyperspace for full Linked Orders Report.   750 mg 150 mL/hr over 60 Minutes Intravenous  Once 12/11/22  2206 12/11/22 2208   12/11/22 2330  vancomycin (VANCOCIN) IVPB 1000 mg/200 mL premix       See Hyperspace for full Linked Orders Report.   1,000 mg 200 mL/hr over 60 Minutes Intravenous  Once 12/11/22 2216 12/12/22 0133    12/11/22 2215  vancomycin (VANCOCIN) IVPB 1000 mg/200 mL premix  Status:  Discontinued       See Hyperspace for full Linked Orders Report.   1,000 mg 200 mL/hr over 60 Minutes Intravenous  Once 12/11/22 2206 12/11/22 2208   12/11/22 2215  vancomycin (VANCOCIN) IVPB 1000 mg/200 mL premix       See Hyperspace for full Linked Orders Report.   1,000 mg 200 mL/hr over 60 Minutes Intravenous  Once 12/11/22 2216 12/12/22 0030   12/11/22 2200  ceFEPIme (MAXIPIME) 1 g in sodium chloride 0.9 % 100 mL IVPB  Status:  Discontinued        1 g 200 mL/hr over 30 Minutes Intravenous  Once 12/11/22 2149 12/11/22 2325   12/11/22 2000  piperacillin-tazobactam (ZOSYN) IVPB 3.375 g        3.375 g 100 mL/hr over 30 Minutes Intravenous  Once 12/11/22 1959 12/11/22 2037      Subjective: Patient was seen and examined at bedside. Overnight events noted.  Patient reports doing much improved. She is successfully weaned down to room air. She is being transitioned to Eliquis from heparin today.  She seems anxious and asked to resume Xanax.   Objective: Vitals:   12/14/22 0114 12/14/22 0611 12/14/22 0754 12/14/22 1122  BP: 116/74 129/77 128/88 132/84  Pulse: 78 78 74 79  Resp: 18  19 20   Temp: 98.1 F (36.7 C) 97.9 F (36.6 C) 97.9 F (36.6 C) 98.2 F (36.8 C)  TempSrc:  Oral  Oral  SpO2:  98% 97% 98%  Weight:      Height:        Intake/Output Summary (Last 24 hours) at 12/14/2022 1124 Last data filed at 12/13/2022 1834 Gross per 24 hour  Intake 450 ml  Output 600 ml  Net -150 ml   Filed Weights   12/11/22 1906 12/12/22 0443  Weight: 70.3 kg 75.4 kg    Examination:  General exam: Appears comfortable, calm, not in any acute distress. Respiratory system: CTA bilaterally.  Respiratory effort normal, RR 14. Cardiovascular system: S1 & S2 heard, regular rate and rhythm, no murmur. Gastrointestinal system: Abdomen is soft, non tender, non distended, BS+ Central nervous system: Alert and oriented x 3.  No focal neurological deficits. Extremities: No edema, no cyanosis, no clubbing. Skin: No rashes, lesions or ulcers Psychiatry: Judgement and insight appear normal. Mood & affect appropriate.     Data Reviewed: I have personally reviewed following labs and imaging studies  CBC: Recent Labs  Lab 12/11/22 1938 12/12/22 0226 12/13/22 0442  WBC 9.6 9.8 8.6  HGB 11.3* 10.2* 10.5*  HCT 34.9* 31.2* 31.9*  MCV 96.7 96.3 97.9  PLT 258 234 251   Basic Metabolic Panel: Recent Labs  Lab 12/11/22 1938 12/12/22 0226  NA 134* 134*  K 3.6 3.4*  CL 104 103  CO2 22 20*  GLUCOSE 106* 158*  BUN 9 <5*  CREATININE 0.72 0.66  CALCIUM 7.9* 7.5*  MG  --  2.0  PHOS  --  2.7   GFR: Estimated Creatinine Clearance: 89.4 mL/min (by C-G formula based on SCr of 0.66 mg/dL). Liver Function Tests: Recent Labs  Lab 12/11/22 1938  AST 20  ALT 18  ALKPHOS 46  BILITOT 0.7  PROT 8.3*  ALBUMIN 3.1*   Recent Labs  Lab 12/11/22 1938  LIPASE 22   No results for input(s): "AMMONIA" in the last 168 hours. Coagulation Profile: No results for input(s): "INR", "PROTIME" in the last 168 hours. Cardiac Enzymes: No results for input(s): "CKTOTAL", "CKMB", "CKMBINDEX", "TROPONINI" in the last 168 hours. BNP (last 3 results) No results for input(s): "PROBNP" in the last 8760 hours. HbA1C: No results for input(s): "HGBA1C" in the last 72 hours. CBG: Recent Labs  Lab 12/12/22 0121  GLUCAP 149*   Lipid Profile: No results for input(s): "CHOL", "HDL", "LDLCALC", "TRIG", "CHOLHDL", "LDLDIRECT" in the last 72 hours. Thyroid Function Tests: No results for input(s): "TSH", "T4TOTAL", "FREET4", "T3FREE", "THYROIDAB" in the last 72 hours. Anemia Panel: No results for input(s): "VITAMINB12", "FOLATE", "FERRITIN", "TIBC", "IRON", "RETICCTPCT" in the last 72 hours. Sepsis Labs: Recent Labs  Lab 12/11/22 2013 12/12/22 0225  PROCALCITON  --  <0.10  LATICACIDVEN 0.7 1.0    Recent Results (from the  past 240 hour(s))  Blood culture (routine x 2)     Status: None (Preliminary result)   Collection Time: 12/11/22 10:45 PM   Specimen: BLOOD  Result Value Ref Range Status   Specimen Description   Final    BLOOD LEFT ANTECUBITAL Performed at [facility], [address] [place], [place] [address]    Special Requests   Final    BOTTLES DRAWN AEROBIC AND ANAEROBIC Blood Culture adequate volume Performed at [facility], [address] [place], [place] [address]    Culture   Final    NO GROWTH < 24 HOURS Performed at [facility], [address], [place], [place] [address]    Report Status PENDING  Incomplete  Blood culture (routine x 2)     Status: None (Preliminary result)   Collection Time: 12/11/22 10:50 PM   Specimen: BLOOD  Result Value Ref Range Status   Specimen Description   Final    BLOOD BLOOD LEFT FOREARM Performed at [facility], [address] [place], [place] [address]    Special Requests   Final    BOTTLES DRAWN AEROBIC AND ANAEROBIC Blood Culture adequate volume Performed at [facility], [address] [place], [place] [address]    Culture   Final    NO GROWTH < 24 HOURS Performed at [facility], [address] [place], [place] [address]    Report Status PENDING  Incomplete  Respiratory (~20 pathogens) panel by PCR     Status: None   Collection Time: 12/12/22  1:19 AM   Specimen: Nasopharyngeal Swab; Respiratory  Result Value Ref Range Status   Adenovirus NOT DETECTED NOT DETECTED Final   Coronavirus 229E NOT DETECTED NOT DETECTED Final    Comment: (NOTE) The Coronavirus on the Respiratory Panel, DOES NOT test for the novel  Coronavirus (2019 nCoV)    Coronavirus HKU1 NOT DETECTED NOT DETECTED Final   Coronavirus NL63 NOT DETECTED NOT DETECTED Final   Coronavirus OC43 NOT DETECTED NOT DETECTED Final   Metapneumovirus NOT DETECTED NOT DETECTED Final   Rhinovirus / Enterovirus NOT DETECTED NOT  DETECTED Final   Influenza A NOT DETECTED NOT DETECTED Final   Influenza B NOT DETECTED NOT DETECTED Final   Parainfluenza Virus 1 NOT DETECTED NOT DETECTED Final   Parainfluenza Virus 2 NOT DETECTED NOT DETECTED Final   Parainfluenza Virus 3 NOT  DETECTED NOT DETECTED Final   Parainfluenza Virus 4 NOT DETECTED NOT DETECTED Final   Respiratory Syncytial Virus NOT DETECTED NOT DETECTED Final   Bordetella pertussis NOT DETECTED NOT DETECTED Final   Bordetella Parapertussis NOT DETECTED NOT DETECTED Final   Chlamydophila pneumoniae NOT DETECTED NOT DETECTED Final   Mycoplasma pneumoniae NOT DETECTED NOT DETECTED Final    Comment: Performed at [facility], [address] [place], [place] [address]  MRSA Next Gen by PCR, Nasal     Status: None   Collection Time: 12/12/22  1:25 AM  Result Value Ref Range Status   MRSA by PCR Next Gen NOT DETECTED NOT DETECTED Final    Comment: (NOTE) The GeneXpert MRSA Assay (FDA approved for NASAL specimens only), is one component of a comprehensive MRSA colonization surveillance program. It is not intended to diagnose MRSA infection nor to guide or monitor treatment for MRSA infections. Test performance is not FDA approved in patients less than 10 years old. Performed at [facility], [address] [place], [place] [address]     Radiology Studies: DG Chest Port 1 View  Result Date: 12/13/2022 CLINICAL DATA:  Right lower lobe pneumonia. EXAM: PORTABLE CHEST 1 VIEW COMPARISON:  Chest CTA, 12/11/2022. FINDINGS: Cardiac silhouette normal in size.  No mediastinal or hilar masses. Mild hazy opacity at the right lung base blunts the lateral right costophrenic sulcus. Opacity at the medial left lung base. Remainder of the lungs is clear. No convincing pleural effusion and no pneumothorax. Skeletal structures are grossly intact. IMPRESSION: 1. Lung base opacities as detailed at are without convincing change from the recent CTA of the chest. Lung base  opacities may reflect infection, favor of the right, atelectasis or a combination. No new abnormalities. Electronically Signed   By: Amie Portland M.D.   On: 12/13/2022 11:21   VAS [place] LOWER EXTREMITY VENOUS (DVT)  Result Date: 12/12/2022  Lower Venous DVT Study Patient Name:  Carrie Coffey  Date of Exam:   12/12/2022 Medical Rec #: [id number]          Accession #:    [id number] Date of Birth: 1985/08/08           Patient Gender: F Patient Age:   57 years Exam Location:  [facility] Procedure:      VAS [place] LOWER EXTREMITY VENOUS (DVT) Referring Phys: STEPHANIE REESE --------------------------------------------------------------------------------  Indications: Pulmonary embolism. Other Indications: Recent rotator cuff injury- patient reports decrease in                    activity during recovery. Comparison Study: No prior studies. Performing Technologist: Jean Rosenthal RDMS, RVT  Examination Guidelines: A complete evaluation includes B-mode imaging, spectral Doppler, color Doppler, and power Doppler as needed of all accessible portions of each vessel. Bilateral testing is considered an integral part of a complete examination. Limited examinations for reoccurring indications may be performed as noted. The reflux portion of the exam is performed with the patient in reverse Trendelenburg.  +---------+---------------+---------+-----------+----------+--------------+ RIGHT    CompressibilityPhasicitySpontaneityPropertiesThrombus Aging +---------+---------------+---------+-----------+----------+--------------+ CFV      Full           Yes      Yes                                 +---------+---------------+---------+-----------+----------+--------------+ SFJ      Full                                                        +---------+---------------+---------+-----------+----------+--------------+  FV Prox  Full                                                         +---------+---------------+---------+-----------+----------+--------------+ FV Mid   Full                                                        +---------+---------------+---------+-----------+----------+--------------+ FV DistalFull                                                        +---------+---------------+---------+-----------+----------+--------------+ PFV      Full                                                        +---------+---------------+---------+-----------+----------+--------------+ POP      Full           Yes      Yes                                 +---------+---------------+---------+-----------+----------+--------------+ PTV      Full                                                        +---------+---------------+---------+-----------+----------+--------------+ PERO     Full                                                        +---------+---------------+---------+-----------+----------+--------------+ Gastroc  Full                                                        +---------+---------------+---------+-----------+----------+--------------+   +---------+---------------+---------+-----------+----------+--------------+ LEFT     CompressibilityPhasicitySpontaneityPropertiesThrombus Aging +---------+---------------+---------+-----------+----------+--------------+ CFV      Full           Yes      Yes                                 +---------+---------------+---------+-----------+----------+--------------+ SFJ      Full                                                        +---------+---------------+---------+-----------+----------+--------------+  FV Prox  Full                                                        +---------+---------------+---------+-----------+----------+--------------+ FV Mid   Full                                                         +---------+---------------+---------+-----------+----------+--------------+ FV DistalFull                                                        +---------+---------------+---------+-----------+----------+--------------+ PFV      Full                                                        +---------+---------------+---------+-----------+----------+--------------+ POP      Full           Yes      Yes                                 +---------+---------------+---------+-----------+----------+--------------+ PTV      Full                                                        +---------+---------------+---------+-----------+----------+--------------+ PERO     Full                                                        +---------+---------------+---------+-----------+----------+--------------+ Gastroc  Full                                                        +---------+---------------+---------+-----------+----------+--------------+     Summary: RIGHT: - There is no evidence of deep vein thrombosis in the lower extremity.  - No cystic structure found in the popliteal fossa.  LEFT: - There is no evidence of deep vein thrombosis in the lower extremity.  - No cystic structure found in the popliteal fossa.  *See table(s) above for measurements and observations. Electronically signed by Heath Lark on 12/12/2022 at 6:07:38 PM.    Final     Scheduled Meds:  apixaban  10 mg Oral BID   Followed by   Melene Muller ON 12/21/2022] apixaban  5 mg Oral BID   Chlorhexidine Gluconate Cloth  6 each Topical Q0600  Continuous Infusions:  sodium chloride Stopped (12/12/22 1357)   piperacillin-tazobactam (ZOSYN)  IV 3.375 g (12/14/22 0554)     LOS: 2 days    Time spent: 35 mins    Willeen Niece, MD [facility]   If 7PM-7AM, please contact night-coverage

## 2022-12-15 DIAGNOSIS — I2609 Other pulmonary embolism with acute cor pulmonale: Secondary | ICD-10-CM | POA: Diagnosis not present

## 2022-12-15 LAB — CULTURE, BLOOD (ROUTINE X 2): Special Requests: ADEQUATE

## 2022-12-15 MED ORDER — AMOXICILLIN-POT CLAVULANATE 875-125 MG PO TABS: 1.0000 | ORAL_TABLET | Freq: Two times a day (BID) | ORAL | 0 refills | Status: AC

## 2022-12-15 MED ORDER — APIXABAN 5 MG PO TABS: ORAL_TABLET | ORAL | 4 refills | Status: AC

## 2022-12-15 MED ORDER — ACETAMINOPHEN 325 MG PO TABS
650.0000 mg | ORAL_TABLET | ORAL | Status: DC | PRN
  Administered 2022-12-15: 650 mg via ORAL
  Filled 2022-12-15: qty 2

## 2022-12-15 NOTE — Plan of Care (Signed)
Patient is resting in bed with normal vitals and respirations. Pain has been addressed and managed. Education has been provided to patient and husband. Understanding has been reported on both sides. No s/s of chest pain, or SOB. Patient is independently ambulating in room with no distress. Patient is scheduled for discharge today and will begin to prepare. Plan of care is ongoing.  Problem: Education: Goal: Knowledge of General Education information will improve Description: Including pain rating scale, medication(s)/side effects and non-pharmacologic comfort measures Outcome: Progressing   Problem: Health Behavior/Discharge Planning: Goal: Ability to manage health-related needs will improve Outcome: Progressing   Problem: Clinical Measurements: Goal: Ability to maintain clinical measurements within normal limits will improve Outcome: Progressing Goal: Will remain free from infection Outcome: Progressing Goal: Diagnostic test results will improve Outcome: Progressing Goal: Respiratory complications will improve Outcome: Progressing Goal: Cardiovascular complication will be avoided Outcome: Progressing   Problem: Activity: Goal: Risk for activity intolerance will decrease Outcome: Progressing   Problem: Nutrition: Goal: Adequate nutrition will be maintained Outcome: Progressing   Problem: Coping: Goal: Level of anxiety will decrease Outcome: Progressing   Problem: Elimination: Goal: Will not experience complications related to bowel motility Outcome: Progressing Goal: Will not experience complications related to urinary retention Outcome: Progressing   Problem: Pain Managment: Goal: General experience of comfort will improve Outcome: Progressing   Problem: Safety: Goal: Ability to remain free from injury will improve Outcome: Progressing   Problem: Skin Integrity: Goal: Risk for impaired skin integrity will decrease Outcome: Progressing   Problem: Education: Goal:  Knowledge of General Education information will improve Description: Including pain rating scale, medication(s)/side effects and non-pharmacologic comfort measures Outcome: Progressing   Problem: Health Behavior/Discharge Planning: Goal: Ability to manage health-related needs will improve Outcome: Progressing   Problem: Clinical Measurements: Goal: Ability to maintain clinical measurements within normal limits will improve Outcome: Progressing Goal: Will remain free from infection Outcome: Progressing Goal: Diagnostic test results will improve Outcome: Progressing Goal: Respiratory complications will improve Outcome: Progressing Goal: Cardiovascular complication will be avoided Outcome: Progressing   Problem: Activity: Goal: Risk for activity intolerance will decrease Outcome: Progressing   Problem: Nutrition: Goal: Adequate nutrition will be maintained Outcome: Progressing   Problem: Coping: Goal: Level of anxiety will decrease Outcome: Progressing   Problem: Elimination: Goal: Will not experience complications related to bowel motility Outcome: Progressing Goal: Will not experience complications related to urinary retention Outcome: Progressing   Problem: Pain Managment: Goal: General experience of comfort will improve Outcome: Progressing   Problem: Safety: Goal: Ability to remain free from injury will improve Outcome: Progressing   Problem: Skin Integrity: Goal: Risk for impaired skin integrity will decrease Outcome: Progressing

## 2022-12-15 NOTE — Plan of Care (Signed)

## 2022-12-15 NOTE — TOC Transition Note (Signed)
Transition of Care [facility]) - CM/SW Discharge Note   Patient Details  Name: Carrie Coffey MRN: [id number] Date of Birth: 1986-03-13  [facility] CM/SW Contact:  Ronny Bacon, RN Phone Number: 12/15/2022, 10:40 AM   Clinical Narrative:   Spoke with patient and spouse at bedside. Spouse will transport patient home today. Patient given 30 day Eliquis card and Copay card for Eliquis.    Final next level of care: Home/Self Care Barriers to Discharge: No Barriers Identified   Patient Goals and CMS Choice CMS Medicare.gov Compare Post Acute Care list provided to:: Patient Choice offered to / list presented to : Patient  Discharge Placement                         Discharge Plan and Services Additional resources added to the After Visit Summary for     Discharge Planning Services: CM Consult, Follow-up appt scheduled Post Acute Care Choice: Resumption of Svcs/PTA Provider                               Social Determinants of Health (SDOH) Interventions SDOH Screenings   Food Insecurity: No Food Insecurity (07/05/2021)  Depression (PHQ2-9): Low Risk  (07/05/2021)  Tobacco Use: Low Risk  (12/11/2022)     Readmission Risk Interventions    12/14/2022    3:56 PM  Readmission Risk Prevention Plan  Post Dischage Appt Complete  Medication Screening Complete  Transportation Screening Complete

## 2022-12-15 NOTE — Progress Notes (Signed)
Patient's IV has been removed. Pain is resolved. All teachings have been given to patient and husband. All questions have been answered and no confusion is present. Safety has been promoted and enforced for continuity of care. Patient is being discharged per order.   Lawana Pai, RN

## 2022-12-15 NOTE — Discharge Summary (Signed)
Physician Discharge Summary  Carrie Coffey RUE:[id number] DOB: [birth year] DOA: 12/11/2022  PCP: Hoover Browns, MD  Admit date: 12/11/2022  Discharge date: 12/15/2022  Admitted From: Home  Disposition:  Home  Recommendations for Outpatient Follow-up:  Follow up with PCP in 1-2 weeks Please obtain BMP/CBC in one week Advised to follow-up with heme oncology for thromboembolism workup Advised to take Eliquis 10 mg twice daily for 7 days followed by Eliquis 5 mg twice daily for 6 to 9 months. Advised to take Augmentin twice daily for 5 days.  Home Health:None Equipment/Devices:None  Discharge Condition: Stable CODE STATUS:Full code Diet recommendation: Heart Healthy  Brief [facility] Course: This 37 years old female with PMH significant for anxiety, psoriasis, recent right rotator cuff injury seen by orthopedics on 12/10/22 with plan for outpatient MRI. She presented in the ED with complaints of shortness of breath, RUQ abdominal pain and right shoulder pain for 1 week. Workup in the ED, RUQ ultrasound without cholelithiasis or acute cholecystitis. CT A&P showed patchy bilateral lower lobe infiltrates, small pleural effusion. CTA chest demonstrated bilateral pulmonary embolism with mild right heart strain and posterior RLL infiltrate. Patient was initiated on IV antibiotics and heparin gtt. Patient was initially admitted under ICU, subsequently transferred to [facility].  Patient was continued on IV heparin and IV antibiotics.  She had made significant improvement.  She is weaned down to room air.  Echocardiogram showed LVEF 60 to 65%, right ventricular systolic function is normal.  Bilateral lower extremity duplex negative for DVT.  Patient was subsequently transitioned to Eliquis and tolerated well.  Patient was also continued on IV antibiotics.  She has ambulated in the hallway without any difficulty breathing.  Patient being discharged home on antibiotics and Eliquis.  Patient will follow-up with  heme-onc for workup for thromboembolism.  Patient wants to be discharged.  Discharge Diagnoses:  Principal Problem:   Pulmonary embolism (HCC)  Bilateral pulmonary embolism: Patient presented with shortness of breath, RUQ pain, right shoulder pain for 1 week. CTA chest showed bilateral pulmonary embolism with evidence of right heart strain. Patient was initiated on therapeutic anticoagulation with heparin gtt. Troponin 38 slightly elevated likely secondary to PE, BNP 60.4,  procalcitonin 0.10, Lactic acid 1.0 Echo showed LVEF 60 to 65%, right ventricle systolic function is normal. Bilateral lower extremity venous duplex negative for DVT. Pulmonology recommended to continue anticoagulation with subsequent transition to oral anticoagulation. She may need hematology referral as an outpatient for thromboembolism. Heparin successfully transitioned with Eliquis.   Acute hypoxic respiratory failure: Right lower lobe pneumonia: Continued supplemental oxygen, She is successfully weaned down to room air. Continue Vanco/Zosyn for broad-spectrum antibiotic coverage. Vancomycin discontinued as MRSA nares negative. Blood cultures no growth, RVP negative. Patient being discharged on Augmentin.   Small right pleural effusion. Continue antibiotics, effusion small for drainage.   Right breast retroareolar lesion: Obtain outpatient MRI breast and eval.   Anxiety disorder: Resume Xanax as needed.  Discharge Instructions  Discharge Instructions     Call MD for:  difficulty breathing, headache or visual disturbances   Complete by: As directed    Call MD for:  persistant dizziness or light-headedness   Complete by: As directed    Call MD for:  persistant nausea and vomiting   Complete by: As directed    Diet - low sodium heart healthy   Complete by: As directed    Diet Carb Modified   Complete by: As directed    Discharge instructions   Complete by:  As directed    Advised to follow-up with  primary care physician in 1 week. Advised to follow-up with heme oncology for thromboembolism. Advised to take Eliquis 10 mg twice daily for 7 days followed by Eliquis 5 mg twice daily for 6 to 9 months.   Increase activity slowly   Complete by: As directed       Allergies as of 12/15/2022       Reactions   Apple Juice Hives, Swelling   Food Hives, Swelling, Other (See Comments)   Pt states that she is allergic to strawberries.     Pear Hives, Swelling   Allegra Allergy [fexofenadine Hcl] Hives   Pseudoephedrine Hives        Medication List     STOP taking these medications    meloxicam 15 MG tablet Commonly known as: MOBIC   senna-docusate 8.6-50 MG tablet Commonly known as: Senokot-S       TAKE these medications    amoxicillin-clavulanate 875-125 MG tablet Commonly known as: AUGMENTIN Take 1 tablet by mouth 2 (two) times daily for 5 days.   apixaban 5 MG Tabs tablet Commonly known as: ELIQUIS Advised to take Eliquis 10 mg twice a day for 7 days followed by Eliquis 5 mg twice daily for 6 to 9 months. Start taking on: December 21, 2022   ibuprofen 600 MG tablet Commonly known as: ADVIL Take 1 tablet (600 mg total) by mouth every 6 (six) hours as needed for cramping.   tiZANidine 2 MG tablet Commonly known as: ZANAFLEX Take 2 mg by mouth once.        Follow-up Information     Kyra Manges T, PA-C. Go on 12/21/2022.   Specialty: Physician Assistant Why: You are scheduled for a hospital follow up on Friday, 12/21/2022 at 1030 am. Contact information: [address] [place] [address] [phone]         Hoover Browns, MD Follow up in 1 week(s).   Specialty: Obstetrics and Gynecology Contact information: [address] [place] [address] [phone]         Johney Maine, MD Follow up in 1 week(s).   Specialties: Hematology, Oncology Contact information: [address] [place]  [address] [phone]                Allergies  Allergen Reactions   Apple Juice Hives and Swelling   Food Hives, Swelling and Other (See Comments)    Pt states that she is allergic to strawberries.     Pear Hives and Swelling   Allegra Allergy [Fexofenadine Hcl] Hives   Pseudoephedrine Hives    Consultations: None   Procedures/Studies: DG Chest Port 1 View  Result Date: 12/13/2022 CLINICAL DATA:  Right lower lobe pneumonia. EXAM: PORTABLE CHEST 1 VIEW COMPARISON:  Chest CTA, 12/11/2022. FINDINGS: Cardiac silhouette normal in size.  No mediastinal or hilar masses. Mild hazy opacity at the right lung base blunts the lateral right costophrenic sulcus. Opacity at the medial left lung base. Remainder of the lungs is clear. No convincing pleural effusion and no pneumothorax. Skeletal structures are grossly intact. IMPRESSION: 1. Lung base opacities as detailed at are without convincing change from the recent CTA of the chest. Lung base opacities may reflect infection, favor of the right, atelectasis or a combination. No new abnormalities. Electronically Signed   By: Amie Portland M.D.   On: 12/13/2022 11:21   VAS [place] LOWER EXTREMITY VENOUS (DVT)  Result Date: 12/12/2022  Lower  Venous DVT Study Patient Name:  Carrie Coffey  Date of Exam:   12/12/2022 Medical Rec #: [id number]          Accession #:    [id number] Date of Birth: 18-Feb-1986           Patient Gender: F Patient Age:   62 years Exam Location:  [facility] Procedure:      VAS [place] LOWER EXTREMITY VENOUS (DVT) Referring Phys: STEPHANIE REESE --------------------------------------------------------------------------------  Indications: Pulmonary embolism. Other Indications: Recent rotator cuff injury- patient reports decrease in                    activity during recovery. Comparison Study: No prior studies. Performing Technologist: Jean Rosenthal RDMS, RVT  Examination Guidelines: A complete evaluation includes B-mode imaging,  spectral Doppler, color Doppler, and power Doppler as needed of all accessible portions of each vessel. Bilateral testing is considered an integral part of a complete examination. Limited examinations for reoccurring indications may be performed as noted. The reflux portion of the exam is performed with the patient in reverse Trendelenburg.  +---------+---------------+---------+-----------+----------+--------------+ RIGHT    CompressibilityPhasicitySpontaneityPropertiesThrombus Aging +---------+---------------+---------+-----------+----------+--------------+ CFV      Full           Yes      Yes                                 +---------+---------------+---------+-----------+----------+--------------+ SFJ      Full                                                        +---------+---------------+---------+-----------+----------+--------------+ FV Prox  Full                                                        +---------+---------------+---------+-----------+----------+--------------+ FV Mid   Full                                                        +---------+---------------+---------+-----------+----------+--------------+ FV DistalFull                                                        +---------+---------------+---------+-----------+----------+--------------+ PFV      Full                                                        +---------+---------------+---------+-----------+----------+--------------+ POP      Full           Yes      Yes                                 +---------+---------------+---------+-----------+----------+--------------+  PTV      Full                                                        +---------+---------------+---------+-----------+----------+--------------+ PERO     Full                                                        +---------+---------------+---------+-----------+----------+--------------+ Gastroc   Full                                                        +---------+---------------+---------+-----------+----------+--------------+   +---------+---------------+---------+-----------+----------+--------------+ LEFT     CompressibilityPhasicitySpontaneityPropertiesThrombus Aging +---------+---------------+---------+-----------+----------+--------------+ CFV      Full           Yes      Yes                                 +---------+---------------+---------+-----------+----------+--------------+ SFJ      Full                                                        +---------+---------------+---------+-----------+----------+--------------+ FV Prox  Full                                                        +---------+---------------+---------+-----------+----------+--------------+ FV Mid   Full                                                        +---------+---------------+---------+-----------+----------+--------------+ FV DistalFull                                                        +---------+---------------+---------+-----------+----------+--------------+ PFV      Full                                                        +---------+---------------+---------+-----------+----------+--------------+ POP      Full           Yes      Yes                                 +---------+---------------+---------+-----------+----------+--------------+  PTV      Full                                                        +---------+---------------+---------+-----------+----------+--------------+ PERO     Full                                                        +---------+---------------+---------+-----------+----------+--------------+ Gastroc  Full                                                        +---------+---------------+---------+-----------+----------+--------------+     Summary: RIGHT: - There is no evidence of deep vein  thrombosis in the lower extremity.  - No cystic structure found in the popliteal fossa.  LEFT: - There is no evidence of deep vein thrombosis in the lower extremity.  - No cystic structure found in the popliteal fossa.  *See table(s) above for measurements and observations. Electronically signed by Heath Lark on 12/12/2022 at 6:07:38 PM.    Final    ECHOCARDIOGRAM COMPLETE  Result Date: 12/12/2022    ECHOCARDIOGRAM REPORT   Patient Name:   Carrie Coffey Date of Exam: 12/12/2022 Medical Rec #:  [id number]         Height:       61.0 in Accession #:    [id number]        Weight:       166.2 lb Date of Birth:  1985/12/17          BSA:          1.746 m Patient Age:    37 years          BP:           114/63 mmHg Patient Gender: F                 HR:           92 bpm. Exam Location:  Inpatient Procedure: 2D Echo, Color Doppler and Cardiac Doppler Indications:    Pulmonary Embolus  History:        Patient has no prior history of Echocardiogram examinations. No                 significant history noted.  Sonographer:    Milbert Coulter Referring Phys: [id number] STEPHANIE M REESE IMPRESSIONS  1. Left ventricular ejection fraction, by estimation, is 60 to 65%. The left ventricle has normal function. The left ventricle has no regional wall motion abnormalities. Left ventricular diastolic parameters were normal.  2. Right ventricular systolic function is normal. The right ventricular size is normal. There is mildly elevated pulmonary artery systolic pressure. The estimated right ventricular systolic pressure is 41.2 mmHg.  3. The mitral valve is normal in structure. No evidence of mitral valve regurgitation. No evidence of mitral stenosis.  4. Tricuspid valve regurgitation is mild to moderate.  5. The aortic valve is normal in structure. Aortic valve regurgitation is not visualized. No aortic stenosis is  present.  6. The inferior vena cava is normal in size with <50% respiratory variability, suggesting right atrial pressure of 8  mmHg. FINDINGS  Left Ventricle: Left ventricular ejection fraction, by estimation, is 60 to 65%. The left ventricle has normal function. The left ventricle has no regional wall motion abnormalities. The left ventricular internal cavity size was normal in size. There is  no left ventricular hypertrophy. Left ventricular diastolic parameters were normal. Normal left ventricular filling pressure. Right Ventricle: The right ventricular size is normal. No increase in right ventricular wall thickness. Right ventricular systolic function is normal. There is mildly elevated pulmonary artery systolic pressure. The tricuspid regurgitant velocity is 2.88  m/s, and with an assumed right atrial pressure of 8 mmHg, the estimated right ventricular systolic pressure is 41.2 mmHg. Left Atrium: Left atrial size was normal in size. Right Atrium: Right atrial size was normal in size. Pericardium: There is no evidence of pericardial effusion. Mitral Valve: The mitral valve is normal in structure. No evidence of mitral valve regurgitation. No evidence of mitral valve stenosis. Tricuspid Valve: The tricuspid valve is normal in structure. Tricuspid valve regurgitation is mild to moderate. No evidence of tricuspid stenosis. Aortic Valve: The aortic valve is normal in structure. Aortic valve regurgitation is not visualized. No aortic stenosis is present. Aortic valve mean gradient measures 4.0 mmHg. Aortic valve peak gradient measures 7.5 mmHg. Aortic valve area, by VTI measures 2.42 cm. Pulmonic Valve: The pulmonic valve was normal in structure. Pulmonic valve regurgitation is mild. No evidence of pulmonic stenosis. Aorta: The aortic root is normal in size and structure. Venous: The inferior vena cava is normal in size with less than 50% respiratory variability, suggesting right atrial pressure of 8 mmHg. IAS/Shunts: No atrial level shunt detected by color flow Doppler.  LEFT VENTRICLE PLAX 2D LVIDd:         4.10 cm     Diastology LVIDs:          2.70 cm     LV e' medial:    12.90 cm/s LV PW:         0.90 cm     LV E/e' medial:  5.7 LV IVS:        1.00 cm     LV e' lateral:   15.30 cm/s LVOT diam:     1.90 cm     LV E/e' lateral: 4.8 LV SV:         56 LV SV Index:   32 LVOT Area:     2.84 cm  LV Volumes (MOD) LV vol d, MOD A2C: 54.0 ml LV vol d, MOD A4C: 43.7 ml LV vol s, MOD A2C: 23.3 ml LV vol s, MOD A4C: 16.0 ml LV SV MOD A2C:     30.7 ml LV SV MOD A4C:     43.7 ml LV SV MOD BP:      31.1 ml RIGHT VENTRICLE             IVC RV Basal diam:  3.00 cm     IVC diam: 1.90 cm RV Mid diam:    2.40 cm RV S prime:     12.40 cm/s TAPSE (M-mode): 2.0 cm LEFT ATRIUM             Index        RIGHT ATRIUM           Index LA diam:        3.40 cm 1.95 cm/m   RA  Area:     12.50 cm LA Vol (A2C):   45.3 ml 25.95 ml/m  RA Volume:   27.80 ml  15.92 ml/m LA Vol (A4C):   28.7 ml 16.44 ml/m LA Biplane Vol: 37.3 ml 21.36 ml/m  AORTIC VALVE AV Area (Vmax):    2.11 cm AV Area (Vmean):   2.27 cm AV Area (VTI):     2.42 cm AV Vmax:           137.00 cm/s AV Vmean:          89.800 cm/s AV VTI:            0.230 m AV Peak Grad:      7.5 mmHg AV Mean Grad:      4.0 mmHg LVOT Vmax:         102.00 cm/s LVOT Vmean:        71.800 cm/s LVOT VTI:          0.196 m LVOT/AV VTI ratio: 0.85  AORTA Ao Root diam: 2.60 cm Ao Asc diam:  2.30 cm MITRAL VALVE               TRICUSPID VALVE MV Area (PHT): 4.21 cm    TR Peak grad:   33.2 mmHg MV Decel Time: 180 msec    TR Vmax:        288.00 cm/s MV E velocity: 73.40 cm/s MV A velocity: 56.60 cm/s  SHUNTS MV E/A ratio:  1.30        Systemic VTI:  0.20 m                            Systemic Diam: 1.90 cm Armanda Magic MD Electronically signed by Armanda Magic MD Signature Date/Time: 12/12/2022/10:04:11 AM    Final    CT Angio Chest PE W and/or Wo Contrast  Result Date: 12/11/2022 CLINICAL DATA:  Suspected pulmonary embolism. EXAM: CT ANGIOGRAPHY CHEST WITH CONTRAST TECHNIQUE: Multidetector CT imaging of the chest was performed using the standard  protocol during bolus administration of intravenous contrast. Multiplanar CT image reconstructions and MIPs were obtained to evaluate the vascular anatomy. RADIATION DOSE REDUCTION: This exam was performed according to the departmental dose-optimization program which includes automated exposure control, adjustment of the mA and/or kV according to patient size and/or use of iterative reconstruction technique. CONTRAST:  60mL OMNIPAQUE IOHEXOL 350 MG/ML SOLN COMPARISON:  None Available. FINDINGS: Cardiovascular: The thoracic aorta is normal in appearance. Satisfactory opacification of the pulmonary arteries to the segmental level. Marked severity areas of intraluminal low attenuation are seen throughout numerous bilateral upper lobe, right middle lobe and bilateral lower lobe branches of the bilateral pulmonary arteries. No saddle embolus is identified. Normal heart size with mild right heart strain (RV/LV ratio of 1.14). No pericardial effusion. Mediastinum/Nodes: No enlarged mediastinal, hilar, or axillary lymph nodes. Thyroid gland, trachea, and esophagus demonstrate no significant findings. Lungs/Pleura: Marked severity posterior right lower lobe infiltrate is seen. Mild posterior left basilar atelectasis is also noted. A small right pleural effusion is seen. No pneumothorax is identified. Upper Abdomen: No acute abnormality. Musculoskeletal: No chest wall abnormality. No acute or significant osseous findings. Review of the MIP images confirms the above findings. IMPRESSION: 1. Marked severity bilateral pulmonary embolism with mild right heart strain (RV/LV ratio of 1.14). 2. Marked severity posterior right lower lobe infiltrate. 3. Small right pleural effusion. Electronically Signed   By: Aram Candela M.D.   On: 12/11/2022 22:44  CT ABDOMEN PELVIS W CONTRAST  Result Date: 12/11/2022 CLINICAL DATA:  Abdominal pain.  Biliary obstruction suspected. EXAM: CT ABDOMEN AND PELVIS WITH CONTRAST TECHNIQUE:  Multidetector CT imaging of the abdomen and pelvis was performed using the standard protocol following bolus administration of intravenous contrast. RADIATION DOSE REDUCTION: This exam was performed according to the departmental dose-optimization program which includes automated exposure control, adjustment of the mA and/or kV according to patient size and/or use of iterative reconstruction technique. CONTRAST:  OMNIPAQUE IOHEXOL 300 MG/ML  SOLN COMPARISON:  12/11/2022. FINDINGS: Lower chest: There is a small right pleural effusion with patchy infiltrates in the lower lobes bilaterally. Hepatobiliary: No focal liver abnormality is seen. No gallstones, gallbladder wall thickening, or biliary dilatation. Pancreas: Unremarkable. No pancreatic ductal dilatation or surrounding inflammatory changes. Spleen: Normal in size without focal abnormality. Adrenals/Urinary Tract: The adrenal glands are within normal limits. The kidneys enhance symmetrically. No renal calculus or hydronephrosis. The bladder is unremarkable. Stomach/Bowel: There is a small hiatal hernia. Stomach is within normal limits. Appendix appears normal. No evidence of bowel wall thickening, distention, or inflammatory changes. No free air or pneumatosis. Vascular/Lymphatic: No significant vascular findings are present. No enlarged abdominal or pelvic lymph nodes. Reproductive: Uterus and bilateral adnexa are unremarkable. Other: A trace amount of free fluid is noted in the cul-de-sac which may be physiologic. There is a fat containing periumbilical hernia. Musculoskeletal: There sclerosis at the sacroiliac joints bilaterally, greater on the left than on the right, compatible with sacroiliitis. No acute osseous abnormality. A 2.7 x 1.6 cm lesion is noted in the retroareolar space in the right breast. IMPRESSION: 1. Patchy infiltrates in the lower lobes bilaterally, concerning for pneumonia. 2. Small right pleural effusion. 3. Small hiatal hernia. 4.  2.7 x 1.6 cm lesion in the retroareolar space in the right breast. Diagnostic ultrasound is recommended for further evaluation on non emergent follow-up. Electronically Signed   By: Thornell Sartorius M.D.   On: 12/11/2022 21:50   US ABDOMEN LIMITED RUQ (LIVER/GB)  Result Date: 12/11/2022 CLINICAL DATA:  Right upper quadrant pain and nausea x6 days. EXAM: ULTRASOUND ABDOMEN LIMITED RIGHT UPPER QUADRANT COMPARISON:  None Available. FINDINGS: Gallbladder: No gallstones or wall thickening visualized (2.3 mm). No sonographic Murphy sign noted by sonographer. Common bile duct: Diameter: 3.8 mm Liver: No focal lesion identified. Within normal limits in parenchymal echogenicity. Portal vein is patent on color Doppler imaging with normal direction of blood flow towards the liver. Other: The study is markedly limited secondary to severe patient pain with probe pressure at every location. IMPRESSION: Markedly limited study, as described above, without evidence of cholelithiasis or acute cholecystitis. Electronically Signed   By: Aram Candela M.D.   On: 12/11/2022 20:58     Subjective: Patient was seen and examined at bedside.  Overnight events noted.   Patient reports doing much better and wants to be discharged.  Patient is being discharged home.  Discharge Exam: Vitals:   12/15/22 0447 12/15/22 0759  BP: 112/65 110/66  Pulse: 75 76  Resp: 18 17  Temp: 97.9 F (36.6 C) 97.8 F (36.6 C)  SpO2: 100% 98%   Vitals:   12/14/22 2021 12/15/22 0447 12/15/22 0600 12/15/22 0759  BP: 123/75 112/65  110/66  Pulse: 85 75  76  Resp: 18 18  17   Temp: 98 F (36.7 C) 97.9 F (36.6 C)  97.8 F (36.6 C)  TempSrc:      SpO2: 100% 100%  98%  Weight:  76 kg   Height:        General: Pt is alert, awake, not in acute distress Cardiovascular: RRR, S1/S2 +, no rubs, no gallops Respiratory: CTA bilaterally, no wheezing, no rhonchi Abdominal: Soft, NT, ND, bowel sounds + Extremities: no edema, no  cyanosis    The results of significant diagnostics from this hospitalization (including imaging, microbiology, ancillary and laboratory) are listed below for reference.     Microbiology: Recent Results (from the past 240 hour(s))  Blood culture (routine x 2)     Status: None (Preliminary result)   Collection Time: 12/11/22 10:45 PM   Specimen: BLOOD  Result Value Ref Range Status   Specimen Description   Final    BLOOD LEFT ANTECUBITAL Performed at [facility], [address] [place], [place] [address]    Special Requests   Final    BOTTLES DRAWN AEROBIC AND ANAEROBIC Blood Culture adequate volume Performed at [facility], [address] [place], [place] [address]    Culture   Final    NO GROWTH 3 DAYS Performed at [facility], [address] [place], [place] [address]    Report Status PENDING  Incomplete  Blood culture (routine x 2)     Status: None (Preliminary result)   Collection Time: 12/11/22 10:50 PM   Specimen: BLOOD  Result Value Ref Range Status   Specimen Description   Final    BLOOD BLOOD LEFT FOREARM Performed at [facility], [address] [place], [place] [address]    Special Requests   Final    BOTTLES DRAWN AEROBIC AND ANAEROBIC Blood Culture adequate volume Performed at [facility], [address] [place], [place] [address]    Culture   Final    NO GROWTH 3 DAYS Performed at [facility], [address], [place], [place] [address]    Report Status PENDING  Incomplete  Respiratory (~20 pathogens) panel by PCR     Status: None   Collection Time: 12/12/22  1:19 AM   Specimen: Nasopharyngeal Swab; Respiratory  Result Value Ref Range Status   Adenovirus NOT DETECTED NOT DETECTED Final   Coronavirus 229E NOT DETECTED NOT DETECTED Final    Comment: (NOTE) The Coronavirus on the Respiratory Panel, DOES NOT test for the novel  Coronavirus (2019 nCoV)    Coronavirus HKU1 NOT DETECTED NOT  DETECTED Final   Coronavirus NL63 NOT DETECTED NOT DETECTED Final   Coronavirus OC43 NOT DETECTED NOT DETECTED Final   Metapneumovirus NOT DETECTED NOT DETECTED Final   Rhinovirus / Enterovirus NOT DETECTED NOT DETECTED Final   Influenza A NOT DETECTED NOT DETECTED Final   Influenza B NOT DETECTED NOT DETECTED Final   Parainfluenza Virus 1 NOT DETECTED NOT DETECTED Final   Parainfluenza Virus 2 NOT DETECTED NOT DETECTED Final   Parainfluenza Virus 3 NOT DETECTED NOT DETECTED Final   Parainfluenza Virus 4 NOT DETECTED NOT DETECTED Final   Respiratory Syncytial Virus NOT DETECTED NOT DETECTED Final   Bordetella pertussis NOT DETECTED NOT DETECTED Final   Bordetella Parapertussis NOT DETECTED NOT DETECTED Final   Chlamydophila pneumoniae NOT DETECTED NOT DETECTED Final   Mycoplasma pneumoniae NOT DETECTED NOT DETECTED Final    Comment: Performed at [facility], [address], [place], [place] [address]  MRSA Next Gen by PCR, Nasal     Status: None   Collection Time: 12/12/22  1:25 AM  Result  Value Ref Range Status   MRSA by PCR Next Gen NOT DETECTED NOT DETECTED Final    Comment: (NOTE) The GeneXpert MRSA Assay (FDA approved for NASAL specimens only), is one component of a comprehensive MRSA colonization surveillance program. It is not intended to diagnose MRSA infection nor to guide or monitor treatment for MRSA infections. Test performance is not FDA approved in patients less than 22 years old. Performed at [facility], [address], [place], [place] [address]      Labs: BNP (last 3 results) Recent Labs    12/12/22 0225  BNP 60.4   Basic Metabolic Panel: Recent Labs  Lab 12/11/22 1938 12/12/22 0226 12/14/22 1102  NA 134* 134* 137  K 3.6 3.4* 4.4  CL 104 103 105  CO2 22 20* 24  GLUCOSE 106* 158* 134*  BUN 9 <5* 8  CREATININE 0.72 0.66 0.79  CALCIUM 7.9* 7.5* 8.9  MG  --  2.0 2.4  PHOS  --  2.7 3.0   Liver Function Tests: Recent Labs  Lab  12/11/22 1938 12/14/22 1102  AST 20 36  ALT 18 36  ALKPHOS 46 63  BILITOT 0.7 1.0  PROT 8.3* 8.3*  ALBUMIN 3.1* 2.8*   Recent Labs  Lab 12/11/22 1938  LIPASE 22   No results for input(s): "AMMONIA" in the last 168 hours. CBC: Recent Labs  Lab 12/11/22 1938 12/12/22 0226 12/13/22 0442  WBC 9.6 9.8 8.6  HGB 11.3* 10.2* 10.5*  HCT 34.9* 31.2* 31.9*  MCV 96.7 96.3 97.9  PLT 258 234 251   Cardiac Enzymes: No results for input(s): "CKTOTAL", "CKMB", "CKMBINDEX", "TROPONINI" in the last 168 hours. BNP: Invalid input(s): "POCBNP" CBG: Recent Labs  Lab 12/12/22 0121  GLUCAP 149*   D-Dimer No results for input(s): "DDIMER" in the last 72 hours. Hgb A1c No results for input(s): "HGBA1C" in the last 72 hours. Lipid Profile No results for input(s): "CHOL", "HDL", "LDLCALC", "TRIG", "CHOLHDL", "LDLDIRECT" in the last 72 hours. Thyroid function studies No results for input(s): "TSH", "T4TOTAL", "T3FREE", "THYROIDAB" in the last 72 hours.  Invalid input(s): "FREET3" Anemia work up No results for input(s): "VITAMINB12", "FOLATE", "FERRITIN", "TIBC", "IRON", "RETICCTPCT" in the last 72 hours. Urinalysis    Component Value Date/Time   COLORURINE YELLOW 12/11/2022 1938   APPEARANCEUR CLOUDY (A) 12/11/2022 1938   LABSPEC 1.020 12/11/2022 1938   PHURINE 7.5 12/11/2022 1938   GLUCOSEU NEGATIVE 12/11/2022 1938   HGBUR TRACE (A) 12/11/2022 1938   BILIRUBINUR NEGATIVE 12/11/2022 1938   KETONESUR 15 (A) 12/11/2022 1938   PROTEINUR NEGATIVE 12/11/2022 1938   NITRITE NEGATIVE 12/11/2022 1938   LEUKOCYTESUR SMALL (A) 12/11/2022 1938   Sepsis Labs Recent Labs  Lab 12/11/22 1938 12/12/22 0226 12/13/22 0442  WBC 9.6 9.8 8.6   Microbiology Recent Results (from the past 240 hour(s))  Blood culture (routine x 2)     Status: None (Preliminary result)   Collection Time: 12/11/22 10:45 PM   Specimen: BLOOD  Result Value Ref Range Status   Specimen Description   Final    BLOOD  LEFT ANTECUBITAL Performed at [facility], [address] [place], [place] [address]    Special Requests   Final    BOTTLES DRAWN AEROBIC AND ANAEROBIC Blood Culture adequate volume Performed at [facility], [address] [place], [place] [address]    Culture   Final    NO GROWTH 3 DAYS Performed at [facility],  [address] [place], [place] [address]    Report Status PENDING  Incomplete  Blood culture (routine x 2)     Status: None (Preliminary result)   Collection Time: 12/11/22 10:50 PM   Specimen: BLOOD  Result Value Ref Range Status   Specimen Description   Final    BLOOD BLOOD LEFT FOREARM Performed at [facility], [address] [place], [place] [address]    Special Requests   Final    BOTTLES DRAWN AEROBIC AND ANAEROBIC Blood Culture adequate volume Performed at [facility], [address] [place], [place] [address]    Culture   Final    NO GROWTH 3 DAYS Performed at [facility], [address], [place], [place] [address]    Report Status PENDING  Incomplete  Respiratory (~20 pathogens) panel by PCR     Status: None   Collection Time: 12/12/22  1:19 AM   Specimen: Nasopharyngeal Swab; Respiratory  Result Value Ref Range Status   Adenovirus NOT DETECTED NOT DETECTED Final   Coronavirus 229E NOT DETECTED NOT DETECTED Final    Comment: (NOTE) The Coronavirus on the Respiratory Panel, DOES NOT test for the novel  Coronavirus (2019 nCoV)    Coronavirus HKU1 NOT DETECTED NOT DETECTED Final   Coronavirus NL63 NOT DETECTED NOT DETECTED Final   Coronavirus OC43 NOT DETECTED NOT DETECTED Final   Metapneumovirus NOT DETECTED NOT DETECTED Final   Rhinovirus / Enterovirus NOT DETECTED NOT DETECTED Final   Influenza A NOT DETECTED NOT DETECTED Final   Influenza B NOT DETECTED NOT DETECTED Final   Parainfluenza Virus 1 NOT DETECTED NOT DETECTED Final   Parainfluenza Virus 2 NOT DETECTED NOT DETECTED Final    Parainfluenza Virus 3 NOT DETECTED NOT DETECTED Final   Parainfluenza Virus 4 NOT DETECTED NOT DETECTED Final   Respiratory Syncytial Virus NOT DETECTED NOT DETECTED Final   Bordetella pertussis NOT DETECTED NOT DETECTED Final   Bordetella Parapertussis NOT DETECTED NOT DETECTED Final   Chlamydophila pneumoniae NOT DETECTED NOT DETECTED Final   Mycoplasma pneumoniae NOT DETECTED NOT DETECTED Final    Comment: Performed at [facility], [address], [place], [place] [address]  MRSA Next Gen by PCR, Nasal     Status: None   Collection Time: 12/12/22  1:25 AM  Result Value Ref Range Status   MRSA by PCR Next Gen NOT DETECTED NOT DETECTED Final    Comment: (NOTE) The GeneXpert MRSA Assay (FDA approved for NASAL specimens only), is one component of a comprehensive MRSA colonization surveillance program. It is not intended to diagnose MRSA infection nor to guide or monitor treatment for MRSA infections. Test performance is not FDA approved in patients less than 24 years old. Performed at [facility], [address], [place], [place] [address]      Time coordinating discharge: Over 30 minutes  SIGNED:   Willeen Niece, MD  [facility] 12/15/2022, 2:29 PM Pager   If 7PM-7AM, please contact night-coverage

## 2023-04-18 ENCOUNTER — Emergency Department (HOSPITAL_BASED_OUTPATIENT_CLINIC_OR_DEPARTMENT_OTHER): Payer: BC Managed Care – PPO

## 2023-04-18 ENCOUNTER — Encounter (HOSPITAL_BASED_OUTPATIENT_CLINIC_OR_DEPARTMENT_OTHER): Payer: Self-pay | Admitting: Emergency Medicine

## 2023-04-18 ENCOUNTER — Other Ambulatory Visit: Payer: Self-pay

## 2023-04-18 ENCOUNTER — Emergency Department (HOSPITAL_BASED_OUTPATIENT_CLINIC_OR_DEPARTMENT_OTHER)
Admission: EM | Admit: 2023-04-18 | Discharge: 2023-04-18 | Disposition: A | Discharge status: Home / Self Care | Payer: BC Managed Care – PPO | Attending: Emergency Medicine | Admitting: Emergency Medicine

## 2023-04-18 DIAGNOSIS — R0602 Shortness of breath: Secondary | ICD-10-CM | POA: Insufficient documentation

## 2023-04-18 DIAGNOSIS — R109 Unspecified abdominal pain: Secondary | ICD-10-CM | POA: Diagnosis not present

## 2023-04-18 DIAGNOSIS — R079 Chest pain, unspecified: Secondary | ICD-10-CM | POA: Diagnosis present

## 2023-04-18 DIAGNOSIS — Z7901 Long term (current) use of anticoagulants: Secondary | ICD-10-CM | POA: Insufficient documentation

## 2023-04-18 DIAGNOSIS — R0789 Other chest pain: Secondary | ICD-10-CM | POA: Diagnosis not present

## 2023-04-18 LAB — CBC WITH DIFFERENTIAL/PLATELET
Abs Immature Granulocytes: 0.01 10*3/uL (ref 0.00–0.07)
Basophils Absolute: 0 10*3/uL (ref 0.0–0.1)
Basophils Relative: 0 %
Eosinophils Absolute: 0 10*3/uL (ref 0.0–0.5)
Eosinophils Relative: 0 %
HCT: 39 % (ref 36.0–46.0)
Hemoglobin: 13 g/dL (ref 12.0–15.0)
Immature Granulocytes: 0 %
Lymphocytes Relative: 34 %
Lymphs Abs: 1.8 10*3/uL (ref 0.7–4.0)
MCH: 32.1 pg (ref 26.0–34.0)
MCHC: 33.3 g/dL (ref 30.0–36.0)
MCV: 96.3 fL (ref 80.0–100.0)
Monocytes Absolute: 0.3 10*3/uL (ref 0.1–1.0)
Monocytes Relative: 6 %
Neutro Abs: 3 10*3/uL (ref 1.7–7.7)
Neutrophils Relative %: 60 %
Platelets: 306 10*3/uL (ref 150–400)
RBC: 4.05 MIL/uL (ref 3.87–5.11)
RDW: 13.2 % (ref 11.5–15.5)
WBC: 5.1 10*3/uL (ref 4.0–10.5)
nRBC: 0 % (ref 0.0–0.2)

## 2023-04-18 LAB — COMPREHENSIVE METABOLIC PANEL
ALT: 15 U/L (ref 0–44)
AST: 21 U/L (ref 15–41)
Albumin: 3.7 g/dL (ref 3.5–5.0)
Alkaline Phosphatase: 36 U/L — ABNORMAL LOW (ref 38–126)
Anion gap: 8 (ref 5–15)
BUN: 9 mg/dL (ref 6–20)
CO2: 23 mmol/L (ref 22–32)
Calcium: 9.1 mg/dL (ref 8.9–10.3)
Chloride: 106 mmol/L (ref 98–111)
Creatinine, Ser: 0.76 mg/dL (ref 0.44–1.00)
GFR, Estimated: >60 mL/min (ref >60)
Glucose, Bld: 98 mg/dL (ref 70–99)
Potassium: 4.4 mmol/L (ref 3.5–5.1)
Sodium: 137 mmol/L (ref 135–145)
Total Bilirubin: 0.7 mg/dL (ref 0.3–1.2)
Total Protein: 7.8 g/dL (ref 6.5–8.1)

## 2023-04-18 LAB — PREGNANCY, URINE: Preg Test, Ur: NEGATIVE

## 2023-04-18 MED ORDER — IOHEXOL 350 MG/ML SOLN
100.0000 mL | Freq: Once | INTRAVENOUS | Status: AC | PRN
  Administered 2023-04-18: 75 mL via INTRAVENOUS

## 2023-04-18 NOTE — ED Provider Notes (Signed)
EMERGENCY DEPARTMENT AT [facility] Provider Note   CSN: [id number] Arrival date & time: 04/18/23  8295     History  Chief Complaint  Patient presents with   Flank Pain    Carrie Coffey is a 37 y.o. female.  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  She reports the symptoms started yesterday spontaneously.  It was 30 minutes after a walk but she reports that she walks her dogs every day never has any issues has been having intermittent shortness of breath on occasion ever since having the PEs but there is been no change in that.  She does report over the last week she has had a cough and some mucus but nothing severe.  She is not having significant productive cough or fever.  She is compliant with her Eliquis and has not missed a dose.  She has not had any issues with the eating pain does not move around to the front of her abdomen.  She has no urinary problems.  Walking does not seem to make it worse or eating or urinating.  The only thing that seems to make it worse is taking a deep breath.  She denies any trauma.  The history is provided by the patient.  Flank Pain       Home Medications Prior to Admission medications   Medication Sig Start Date End Date Taking? Authorizing Provider  apixaban (ELIQUIS) 5 MG TABS tablet Advised to take Eliquis 10 mg twice a day for 7 days followed by Eliquis 5 mg twice daily for 6 to 9 months. 12/21/22   Willeen Niece, MD  ibuprofen (ADVIL) 600 MG tablet Take 1 tablet (600 mg total) by mouth every 6 (six) hours as needed for cramping. 08/21/21   Essie Hart, MD  tiZANidine (ZANAFLEX) 2 MG tablet Take 2 mg by mouth once.    [provider]      Allergies    Apple juice, Food, Pear, Allegra allergy [fexofenadine hcl], and Pseudoephedrine     Review of Systems   Review of Systems  Genitourinary:  Positive for flank pain.    Physical Exam Updated Vital Signs BP (!) 148/88 (BP Location: Right Arm)   Pulse 95   Temp 98.4 F (36.9 C) (Oral)   Resp 16   Ht 5\' 1"  (1.549 m)   Wt 70.3 kg   LMP 04/04/2023 (Approximate) Comment: neg preg in er 04/18/2023  SpO2 100%   BMI 29.29 kg/m  Physical Exam Vitals and nursing note reviewed.  Constitutional:      General: She is not in acute distress.    Appearance: She is well-developed.  HENT:     Head: Normocephalic and atraumatic.  Eyes:     Pupils: Pupils are equal, round, and reactive to light.  Cardiovascular:     Rate and Rhythm: Normal rate and regular rhythm.     Heart sounds: Normal heart sounds. No murmur heard.    No friction rub.  Pulmonary:     Effort: Pulmonary effort is normal.     Breath sounds: Normal breath sounds. No wheezing or rales.  Chest:     Chest wall: No tenderness.  Abdominal:     General: Bowel sounds are normal. There is no distension.     Palpations: Abdomen is soft.  Tenderness: There is no abdominal tenderness. There is no right CVA tenderness, guarding or rebound.  Musculoskeletal:        General: No tenderness. Normal range of motion.     Right lower leg: No edema.     Left lower leg: No edema.     Comments: No edema  Skin:    General: Skin is warm and dry.     Findings: No rash.  Neurological:     Mental Status: She is alert and oriented to person, place, and time.     Cranial Nerves: No cranial nerve deficit.  Psychiatric:        Behavior: Behavior normal.     ED Results / Procedures / Treatments   Labs (all labs ordered are listed, but only abnormal results are displayed) Labs Reviewed  COMPREHENSIVE METABOLIC PANEL - Abnormal; Notable for the following components:      Result Value   Alkaline Phosphatase 36 (*)    All other components within normal limits  CBC WITH DIFFERENTIAL/PLATELET  PREGNANCY, URINE     EKG None  Radiology No results found.  Procedures Procedures    Medications Ordered in ED Medications  iohexol (OMNIPAQUE) 350 MG/ML injection 100 mL (75 mLs Intravenous Contrast Given 04/18/23 1134)    ED Course/ Medical Decision Making/ A&P                                 Medical Decision Making Amount and/or Complexity of Data Reviewed Labs: ordered. Decision-making details documented in ED Course. Radiology: ordered and independent interpretation performed. Decision-making details documented in ED Course.  Risk Prescription drug management.   Pt with multiple medical problems and comorbidities and presenting today with a complaint that caries a high risk for morbidity and mortality.  Here today with right sided flank/lower rib pain.  Given patient's history for spontaneous PE that was unprovoked with unknown cause concern for failure of Eliquis and recurrent PE versus pneumonia given patient's recent mild cough with congestion versus pleurisy.  Patient has no exam findings suggestive of cholecystitis, lower suspicion for kidney stone and she denies any symptoms suggestive of pyelonephritis.  No findings suggestive of appendicitis or diverticulitis.  Low suspicion for acute coronary process.  Will do a CTA to ensure no new clot or pneumonia given above history.  Patient is well-appearing here and sats are 100% on room air. 2:31 PM I independently interpreted patient's labs and CBC, CMP and urine pregnancy all within normal limits.  I have independently visualized and interpreted pt's images today. CTA with no evidence of pneumonia today.  Radiology reports1. No evidence of new pulmonary embolus. 2. Overall decreased clot burden when compared with the prior CTA. Some persistent filling defects are seen in the subsegmental pulmonary arteries of the left lower lobe. 3. Decreased right lower lobe consolidation, likely resolving pulmonary infarct. 4. Right periareolar cystic  lesion measuring 3.5 cm. Recommend nonemergent breast ultrasound for further evaluation.  Patient reports she does know about the cystic lesion in her breast and has had an ultrasound and it is related to breast-feeding in the past.  At this time no evidence of pneumonia or worsening PE.  Most likely pleuritic pain from lung irritation.  Patient will continue her Eliquis, follow-up with PCP and was given return precautions.        Final Clinical Impression(s) / ED Diagnoses Final diagnoses:  Acute chest wall pain  Rx / DC Orders ED Discharge Orders     None         Gwyneth Sprout, MD 04/18/23 1431

## 2023-04-18 NOTE — Discharge Instructions (Addendum)
Overall it looks like the amount of clot is improving and the area in the lung that went without blood is improving as well.  There is no evidence of new clots today or infection.  Continue your current Eliquis.  You can take Tylenol as needed for pain but most likely just irritation of the lung lining where you have had the clot in the past may be related to the weather change.  If you start having worsening shortness of breath, fever, coughing up blood or other concerns return to the emergency room.

## 2023-04-18 NOTE — ED Triage Notes (Signed)
Pt to ER with c/o right side pain in upper flank/RUQ pain.  Pt had PNA and bilateral PE in June.  Pt has been on Eliquis since that time.  States pain she is having that is similar to the pain she was having at that time.  Denies urinary symptoms, states mild SHOB but that it is the same as normal [facility] she has intermittently with PE recovery.
